# Patient Record
Sex: Female | Born: 1970 | Race: White | Hispanic: No | Marital: Married | Smoking: Former smoker
Health system: Southern US, Community
[De-identification: ages and names within clinical notes are randomized; demographics above are authoritative.]

## PROBLEM LIST (undated history)

## (undated) DIAGNOSIS — I10 Essential (primary) hypertension: Secondary | ICD-10-CM

## (undated) DIAGNOSIS — N200 Calculus of kidney: Secondary | ICD-10-CM

## (undated) DIAGNOSIS — M3500 Sicca syndrome, unspecified: Secondary | ICD-10-CM

## (undated) DIAGNOSIS — M329 Systemic lupus erythematosus, unspecified: Secondary | ICD-10-CM

## (undated) HISTORY — PX: OTHER SURGICAL HISTORY: SHX169

## (undated) HISTORY — PX: TUBAL LIGATION: SHX77

---

## 2005-06-25 ENCOUNTER — Emergency Department: Payer: Self-pay | Admitting: Emergency Medicine

## 2005-06-29 ENCOUNTER — Other Ambulatory Visit: Payer: Self-pay

## 2005-06-29 ENCOUNTER — Emergency Department: Payer: Self-pay | Admitting: Emergency Medicine

## 2005-07-02 ENCOUNTER — Inpatient Hospital Stay: Payer: Self-pay | Admitting: Internal Medicine

## 2005-07-02 ENCOUNTER — Other Ambulatory Visit: Payer: Self-pay

## 2005-07-03 ENCOUNTER — Other Ambulatory Visit: Payer: Self-pay

## 2005-07-04 ENCOUNTER — Other Ambulatory Visit: Payer: Self-pay

## 2005-07-05 ENCOUNTER — Other Ambulatory Visit: Payer: Self-pay

## 2005-07-27 ENCOUNTER — Ambulatory Visit: Payer: Self-pay

## 2005-07-31 ENCOUNTER — Ambulatory Visit: Payer: Self-pay | Admitting: Urology

## 2005-09-05 ENCOUNTER — Ambulatory Visit: Payer: Self-pay | Admitting: Internal Medicine

## 2005-10-11 ENCOUNTER — Ambulatory Visit: Payer: Self-pay | Admitting: Specialist

## 2005-12-11 ENCOUNTER — Ambulatory Visit: Payer: Self-pay | Admitting: Specialist

## 2005-12-21 ENCOUNTER — Emergency Department: Payer: Self-pay | Admitting: Emergency Medicine

## 2006-09-12 ENCOUNTER — Ambulatory Visit: Payer: Self-pay | Admitting: Family Medicine

## 2007-06-25 IMAGING — CR DG CHEST 2V
1 series · 2 of 2 positions shown · non-contrast
Comparison: none

REASON FOR EXAM: DAESIK
COMMENTS:

[Series 4179: postero_anterior · 0.22mm/px · 2 of 2 slices shown]
[im 1/2]
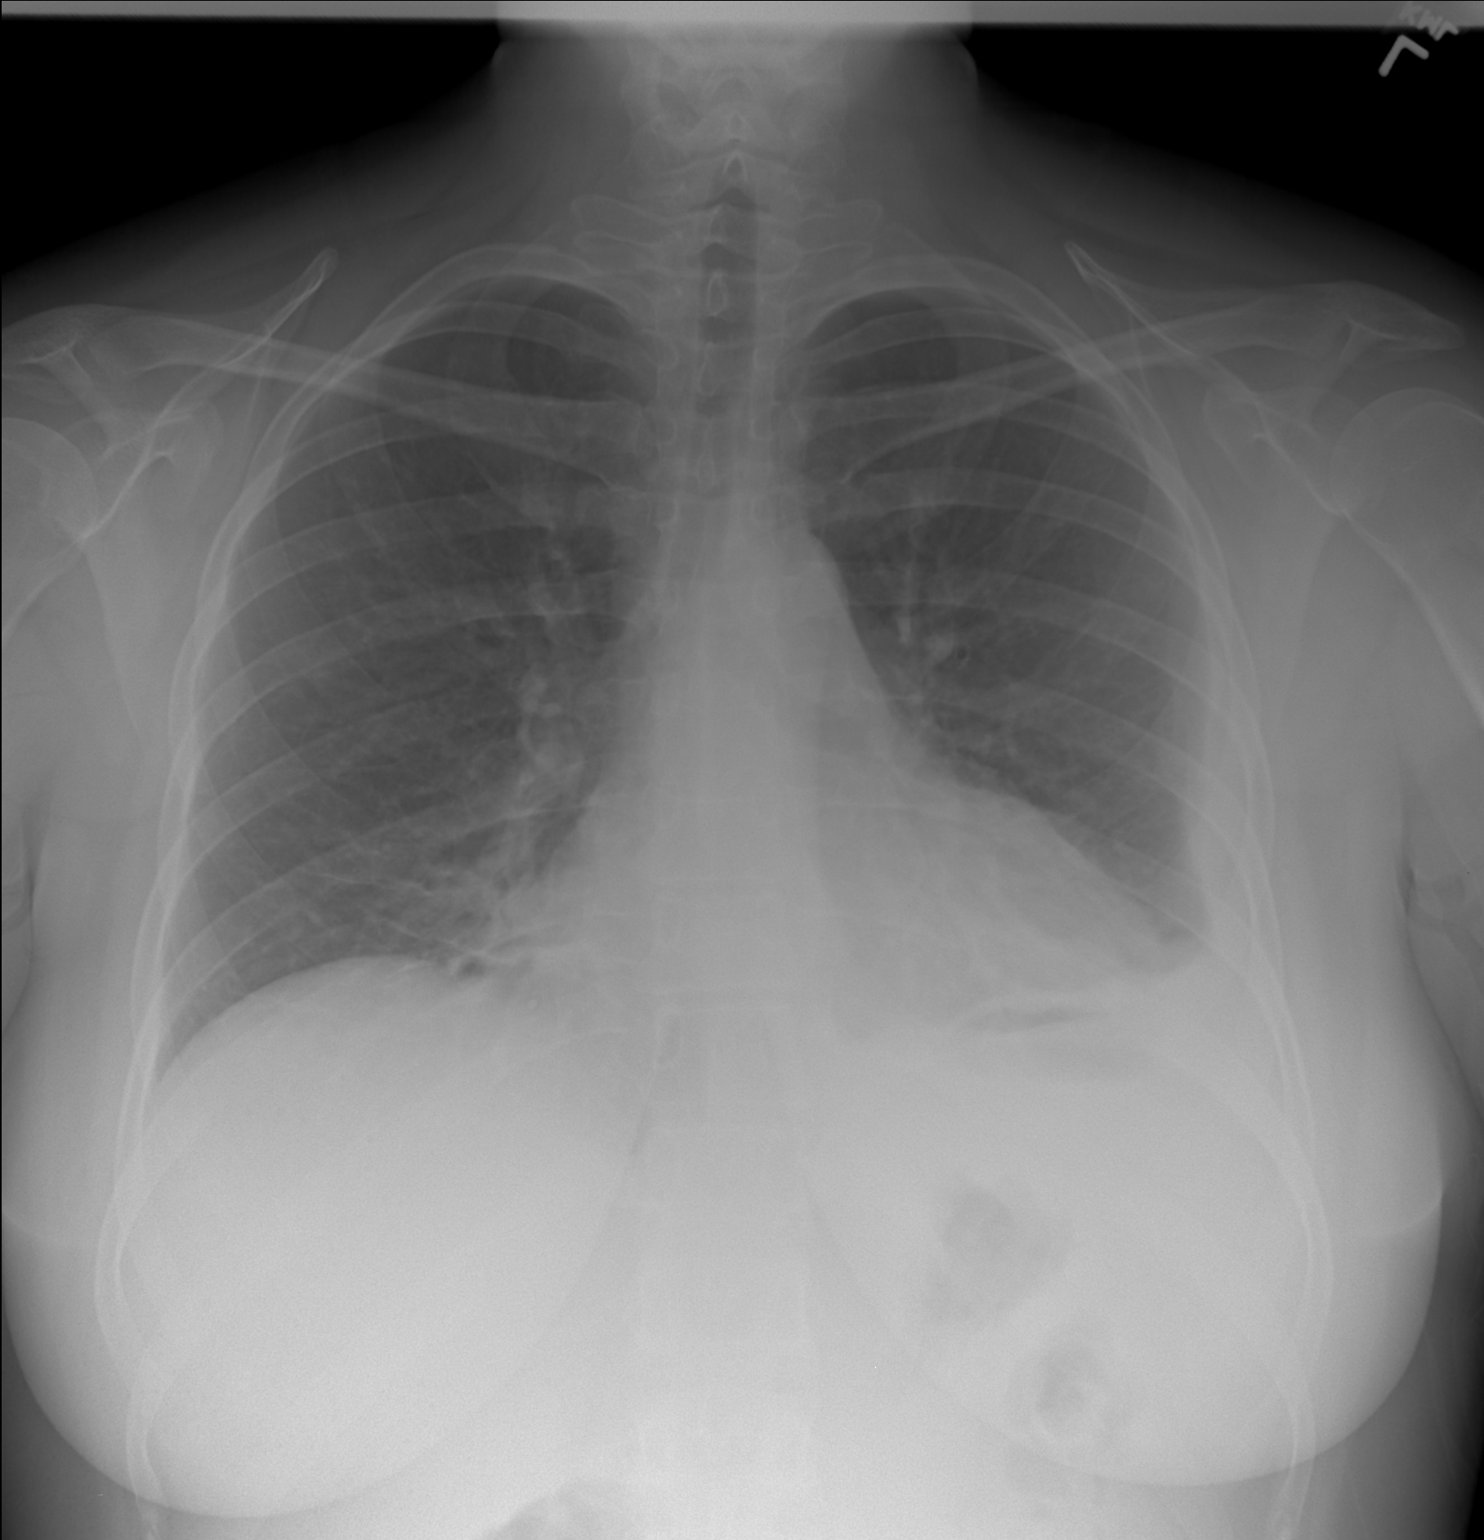
[im 2/2]
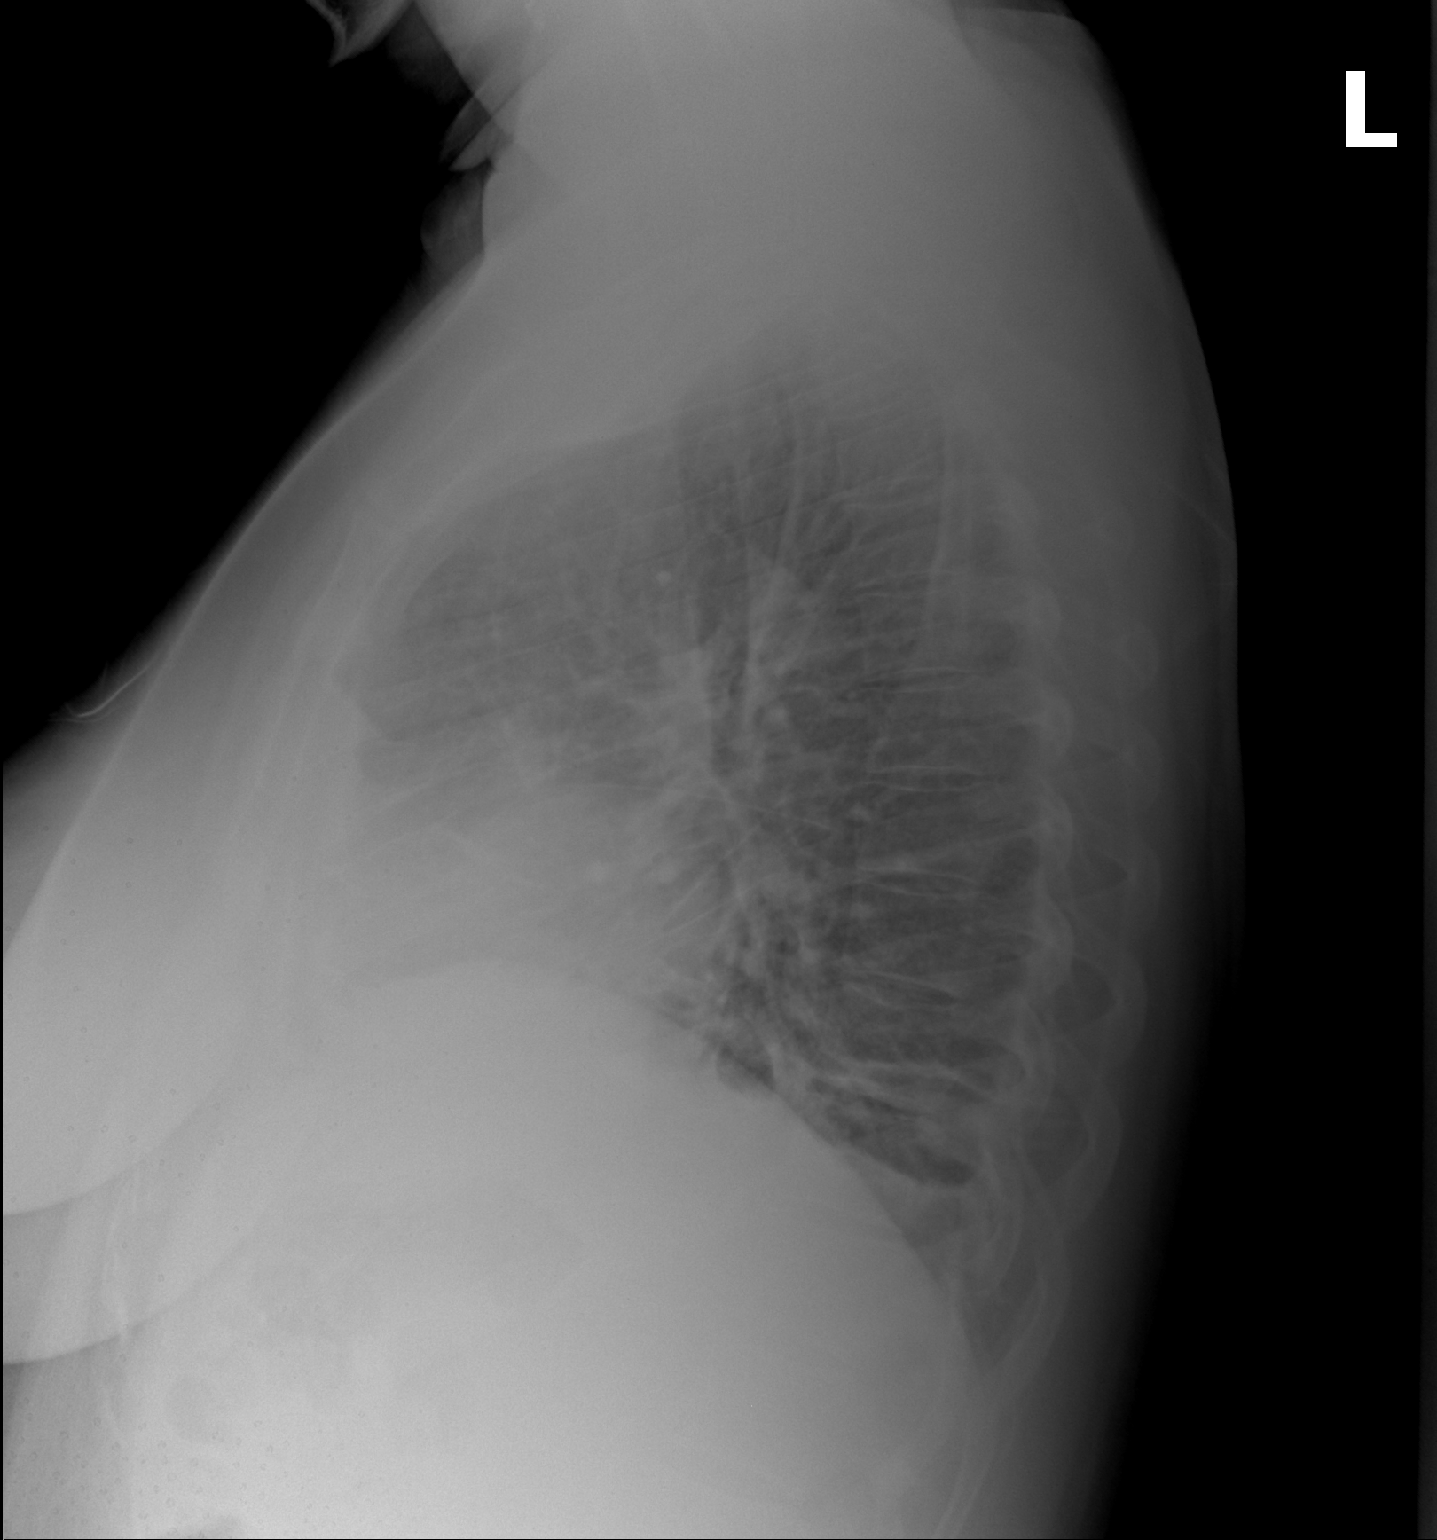

[2 of 2 positions shown; findings below may reference images not displayed]

PROCEDURE:     DXR - DXR CHEST PA (OR AP) AND LATERAL  - December 10, 2005  [DATE]

RESULT:     The current exam is compared to a prior exam of 07/07/05.  The
current exam shows thickening of the pleural shadow along the LEFT lateral
hemithorax and at the LEFT costophrenic angle.  The findings are consistent
with small LEFT pleural effusion although pleural fibrosis could produce a
similar appearance.  No definite pneumonia is seen.  No pneumothorax is
observed.  The pulmonary vasculature is normal in appearance.  Heart size is
normal.
IMPRESSION: There is thickening of the pleural shadow at the LEFT base compatible with a
small effusion or with pleural fibrosis.  This could be further evaluated by
CT if clinically indicated.

## 2008-11-21 ENCOUNTER — Emergency Department: Payer: Self-pay | Admitting: Emergency Medicine

## 2012-04-19 ENCOUNTER — Emergency Department: Payer: Self-pay | Admitting: Emergency Medicine

## 2012-04-19 LAB — URINALYSIS, COMPLETE
Bacteria: NONE SEEN
Bilirubin,UR: NEGATIVE
Glucose,UR: NEGATIVE mg/dL (ref 0–75)
Leukocyte Esterase: NEGATIVE
Ph: 5 (ref 4.5–8.0)
Protein: 100
Specific Gravity: 1.024 (ref 1.003–1.030)
Squamous Epithelial: 14
WBC UR: 2 /HPF (ref 0–5)

## 2012-04-19 LAB — BASIC METABOLIC PANEL
Anion Gap: 5 — ABNORMAL LOW (ref 7–16)
BUN: 9 mg/dL (ref 7–18)
Chloride: 106 mmol/L (ref 98–107)
Co2: 29 mmol/L (ref 21–32)
Creatinine: 0.73 mg/dL (ref 0.60–1.30)
EGFR (Non-African Amer.): >60
Potassium: 3.9 mmol/L (ref 3.5–5.1)
Sodium: 140 mmol/L (ref 136–145)

## 2012-04-19 LAB — CBC
HCT: 40.6 % (ref 35.0–47.0)
MCV: 90 fL (ref 80–100)

## 2016-10-08 HISTORY — PX: BARTHOLIN GLAND CYST EXCISION: SHX565

## 2016-10-13 ENCOUNTER — Emergency Department
Admission: EM | Admit: 2016-10-13 | Discharge: 2016-10-13 | Disposition: A | Discharge status: Home / Self Care | Payer: Commercial Managed Care - PPO | Attending: Emergency Medicine | Admitting: Emergency Medicine

## 2016-10-13 ENCOUNTER — Encounter: Payer: Self-pay | Admitting: *Deleted

## 2016-10-13 DIAGNOSIS — F17210 Nicotine dependence, cigarettes, uncomplicated: Secondary | ICD-10-CM | POA: Diagnosis not present

## 2016-10-13 DIAGNOSIS — R030 Elevated blood-pressure reading, without diagnosis of hypertension: Secondary | ICD-10-CM | POA: Diagnosis not present

## 2016-10-13 DIAGNOSIS — R102 Pelvic and perineal pain: Secondary | ICD-10-CM | POA: Diagnosis present

## 2016-10-13 DIAGNOSIS — N750 Cyst of Bartholin's gland: Secondary | ICD-10-CM | POA: Insufficient documentation

## 2016-10-13 HISTORY — DX: Calculus of kidney: N20.0

## 2016-10-13 HISTORY — DX: Sjogren syndrome, unspecified: M35.00

## 2016-10-13 HISTORY — DX: Systemic lupus erythematosus, unspecified: M32.9

## 2016-10-13 MED ORDER — LIDOCAINE HCL (PF) 1 % IJ SOLN
INTRAMUSCULAR | Status: AC
Start: 2016-10-13 — End: 2016-10-13
  Administered 2016-10-13: 5 mL
  Filled 2016-10-13: qty 5

## 2016-10-13 MED ORDER — SULFAMETHOXAZOLE-TRIMETHOPRIM 800-160 MG PO TABS: 1.0000 | ORAL_TABLET | Freq: Two times a day (BID) | ORAL | 0 refills | Status: DC

## 2016-10-13 NOTE — ED Notes (Signed)
Pt verbalized understanding of discharge instructions. NAD at this time. 

## 2016-10-13 NOTE — ED Provider Notes (Signed)
[facility] Emergency Department Provider Note   ____________________________________________    I have reviewed the triage vital signs and the nursing notes.   HISTORY  Chief Complaint Vaginal Pain     HPI Kristin Finley is a 46 y.o. female who presents with complaints of vaginal pain. She reports worsening pain over the last week along the right side of her vagina. She has never had this before. She says she feels a bump there which is very tender. She denies fevers or chills. No nausea or vomiting.   Past Medical History:  Diagnosis Date  . History of systemic lupus erythematosus (SLE)   . Kidney stone   . Sjogren's syndrome (HCC)     There are no active problems to display for this patient.   Past Surgical History:  Procedure Laterality Date  . lithotripsy Left   . TUBAL LIGATION Bilateral     Prior to Admission medications   Not on File     Allergies Codeine  History reviewed. No pertinent family history.  Social History Social History  Substance Use Topics  . Smoking status: Current Every Day Smoker    Types: Cigarettes  . Smokeless tobacco: Never Used  . Alcohol use No    Review of Systems  Constitutional: No fever/chills     Gastrointestinal: No abdominal pain.  No nausea, no vomiting.   Genitourinary: Negative for dysuria.  Skin: Negative for rash. Neurological: Negative for headaches     ____________________________________________   PHYSICAL EXAM:  VITAL SIGNS: ED Triage Vitals [10/13/16 0638]  Enc Vitals Group     BP (!) 213/115     Pulse Rate 83     Resp 16     Temp 98.2 F (36.8 C)     Temp Source Oral     SpO2 99 %     Weight 200 lb (90.7 kg)     Height 5\' 7"  (1.702 m)     Head Circumference      Peak Flow      Pain Score 4     Pain Loc      Pain Edu?      Excl. in GC?     Constitutional: Alert and oriented. No acute distress. Pleasant and interactive Eyes: Conjunctivae are normal.    Head: Atraumatic. Nose: No congestion/rhinnorhea. Mouth/Throat: Mucous membranes are moist.   Cardiovascular: Normal rate, regular rhythm.  Respiratory: Normal respiratory effort.  No retractions. Genitourinary: Exam performed  With chaperone Paulette. Erythematous and tender area of fluctuance on the right c/w bartholin's cyst Musculoskeletal: No lower extremity tenderness nor edema.   Neurologic:  Normal speech and language. No gross focal neurologic deficits are appreciated.   Skin:  Skin is warm, dry and intact. No rash noted.   ____________________________________________   LABS (all labs ordered are listed, but only abnormal results are displayed)  Labs Reviewed - No data to display ____________________________________________  EKG   ____________________________________________  RADIOLOGY  None ____________________________________________   PROCEDURES  Procedure(s) performed: yes  INCISION AND DRAINAGE Performed by: Jene EveryKINNER, Megan Hayduk Consent: Verbal consent obtained. Risks and benefits: risks, benefits and alternatives were discussed Type: abscess  Body area: bartholin cyst  Anesthesia: local infiltration  Incision was made with a scalpel.  Local anesthetic: lidocaine 1%   Anesthetic total: 2 ml  Complexity: complex   Drainage: purulent  Drainage amount: moderate  Packing material: word catheter, patient could not tolerate inflating balloon  Patient tolerance: Patient tolerated the procedure with no  immediate complications.       Critical Care performed: No ____________________________________________   INITIAL IMPRESSION / ASSESSMENT AND PLAN / ED COURSE  Pertinent labs & imaging results that were available during my care of the patient were reviewed by me and considered in my medical decision making (see chart for details).  Exam consistent with Bartholin's cyst, I and d performed. Patient unable to tolerate inflating word catheter  unfortunately. ABX rx given, follow up with gyn. Discussed with her elevated BP in the ED, likely related to pain. She will need repeat BP check with her pcp. She will schedule appt.   ____________________________________________   FINAL CLINICAL IMPRESSION(S) / ED DIAGNOSES  Final diagnoses:  Bartholin cyst  Elevated BP without diagnosis of hypertension      NEW MEDICATIONS STARTED DURING THIS VISIT:  New Prescriptions   No medications on file     Note:  This document was prepared using Dragon voice recognition software and may include unintentional dictation errors.    Jene Every, MD 10/13/16 [phone]

## 2016-10-13 NOTE — ED Triage Notes (Signed)
Pt c/o painful cyst on R side of labia. Pt states started around Christmas and has gotten progressively worse. Pt states increased pain w/ movement of any kind. Pt denies drainage at this time. Pt states extreme pain to palpation.

## 2017-01-24 ENCOUNTER — Ambulatory Visit (INDEPENDENT_AMBULATORY_CARE_PROVIDER_SITE_OTHER): Payer: Commercial Managed Care - PPO | Admitting: Obstetrics and Gynecology

## 2017-01-24 ENCOUNTER — Encounter: Payer: Self-pay | Admitting: Obstetrics and Gynecology

## 2017-01-24 VITALS — BP 138/81 | HR 79 | Wt 194.3 lb

## 2017-01-24 DIAGNOSIS — N920 Excessive and frequent menstruation with regular cycle: Secondary | ICD-10-CM | POA: Diagnosis not present

## 2017-01-24 NOTE — Progress Notes (Signed)
HPI:      [name] is a 46 y.o. [phone] who LMP was Patient's last menstrual period was 12/24/2016 (approximate).  Subjective:   She presents today Stating that she has not had medical care for a number of years and wants to begin care again. She was seen in the emergency department for heavy bleeding with her period. She says that her periods have been very heavy all of her life but in the last few years seem to be worse. She has significant cramping and bleeding for more than 7 days. She denies intermenstrual bleeding. She does have monthly menses. She is specifically requesting something to make her periods lighter. She smokes tobacco.    Hx: The following portions of the patient's history were reviewed and updated as appropriate:              She  has a past medical history of History of systemic lupus erythematosus (SLE); Kidney stone; and Sjogren's syndrome (HCC). She  does not have a problem list on file. She  has a past surgical history that includes lithotripsy (Left); Tubal ligation (Bilateral); and Bartholin gland cyst excision (10/2016). Her family history includes Cancer in her father; Diabetes in her sister. She  reports that she has been smoking Cigarettes.  She has never used smokeless tobacco. She reports that she does not drink alcohol or use drugs. No current outpatient prescriptions on file prior to visit.   No current facility-administered medications on file prior to visit.          Review of Systems:  Review of Systems  Constitutional: Denied constitutional symptoms, night sweats, recent illness, fatigue, fever, insomnia and weight loss.  Eyes: Denied eye symptoms, eye pain, photophobia, vision change and visual disturbance.  Ears/Nose/Throat/Neck: Denied ear, nose, throat or neck symptoms, hearing loss, nasal discharge, sinus congestion and sore throat.  Cardiovascular: Denied cardiovascular symptoms, arrhythmia, chest pain/pressure, edema, exercise  intolerance, orthopnea and palpitations.  Respiratory: Denied pulmonary symptoms, asthma, pleuritic pain, productive sputum, cough, dyspnea and wheezing.  Gastrointestinal: Denied, gastro-esophageal reflux, melena, nausea and vomiting.  Genitourinary: See HPI for additional information.  Musculoskeletal: Denied musculoskeletal symptoms, stiffness, swelling, muscle weakness and myalgia.  Dermatologic: Denied dermatology symptoms, rash and scar.  Neurologic: Denied neurology symptoms, dizziness, headache, neck pain and syncope.  Psychiatric: Denied psychiatric symptoms, anxiety and depression.  Endocrine: Denied endocrine symptoms including hot flashes and night sweats.   Meds:   No current outpatient prescriptions on file prior to visit.   No current facility-administered medications on file prior to visit.     Objective:     Vitals:   01/24/17 1527  BP: 138/81  Pulse: 79                Assessment:    G2P2002 There are no active problems to display for this patient.    1. Menorrhagia with regular cycle     Possibly fibroids, possibly adenomyosis. Doubt hyperplasia.   Plan:            1.  Ultrasound to rule out fibroids and thickened endometrium.  2.  Patient needs annual exam, Pap, screening blood work, mammogram.  3.  We have discussed various treatment methods to make menses lighter or nonexistent. She likes the idea of IUD use. If her Pap is normal and she has no contraindications for IUD after ultrasound will place Mirena. Risks and benefits of IUD use discussed in detail. She will follow up for her annual exam and  Pap then return for IUD placement with menses. Orders Orders Placed This Encounter  Procedures  . US Transvaginal Non-OB    No orders of the defined types were placed in this encounter.       F/U  Return in about 5 days (around 01/29/2017). I spent 22 minutes with this patient of which greater than 50% was spent discussing heavy menstrual bleeding,  IUD, smoking, fibroid uterus, methods of cycle control.  Elonda Husky, M.D. 01/24/2017 4:32 PM

## 2017-01-30 ENCOUNTER — Encounter: Payer: Self-pay | Admitting: Obstetrics and Gynecology

## 2017-01-30 ENCOUNTER — Other Ambulatory Visit: Payer: Self-pay

## 2017-02-05 ENCOUNTER — Encounter: Payer: Self-pay | Admitting: Obstetrics and Gynecology

## 2017-02-05 ENCOUNTER — Other Ambulatory Visit: Payer: Self-pay

## 2017-02-13 ENCOUNTER — Encounter: Payer: Self-pay | Admitting: Obstetrics and Gynecology

## 2017-02-22 ENCOUNTER — Encounter: Payer: Self-pay | Admitting: Obstetrics and Gynecology

## 2017-02-22 ENCOUNTER — Other Ambulatory Visit: Payer: Self-pay

## 2017-10-06 ENCOUNTER — Other Ambulatory Visit: Payer: Self-pay

## 2017-10-06 ENCOUNTER — Encounter: Payer: Self-pay | Admitting: Radiology

## 2017-10-06 ENCOUNTER — Emergency Department: Payer: Self-pay

## 2017-10-06 ENCOUNTER — Emergency Department
Admission: EM | Admit: 2017-10-06 | Discharge: 2017-10-07 | Disposition: A | Discharge status: Home / Self Care | Payer: Self-pay | Attending: Emergency Medicine | Admitting: Emergency Medicine

## 2017-10-06 DIAGNOSIS — F17210 Nicotine dependence, cigarettes, uncomplicated: Secondary | ICD-10-CM | POA: Insufficient documentation

## 2017-10-06 DIAGNOSIS — R0789 Other chest pain: Secondary | ICD-10-CM | POA: Insufficient documentation

## 2017-10-06 DIAGNOSIS — Z8739 Personal history of other diseases of the musculoskeletal system and connective tissue: Secondary | ICD-10-CM | POA: Insufficient documentation

## 2017-10-06 DIAGNOSIS — R05 Cough: Secondary | ICD-10-CM | POA: Insufficient documentation

## 2017-10-06 DIAGNOSIS — R079 Chest pain, unspecified: Secondary | ICD-10-CM

## 2017-10-06 DIAGNOSIS — Z87442 Personal history of urinary calculi: Secondary | ICD-10-CM | POA: Insufficient documentation

## 2017-10-06 DIAGNOSIS — R6889 Other general symptoms and signs: Secondary | ICD-10-CM | POA: Insufficient documentation

## 2017-10-06 LAB — BASIC METABOLIC PANEL
Anion gap: 5 (ref 5–15)
BUN: 12 mg/dL (ref 6–20)
CALCIUM: 8.4 mg/dL — AB (ref 8.9–10.3)
CO2: 26 mmol/L (ref 22–32)
CREATININE: 0.7 mg/dL (ref 0.44–1.00)
Chloride: 108 mmol/L (ref 101–111)
GFR calc Af Amer: >60 mL/min (ref >60)
GFR calc non Af Amer: >60 mL/min (ref >60)
GLUCOSE: 134 mg/dL — AB (ref 65–99)
Potassium: 3 mmol/L — ABNORMAL LOW (ref 3.5–5.1)
Sodium: 139 mmol/L (ref 135–145)

## 2017-10-06 LAB — CBC
HCT: 37.5 % (ref 35.0–47.0)
Hemoglobin: 12.6 g/dL (ref 12.0–16.0)
MCH: 28.1 pg (ref 26.0–34.0)
MCHC: 33.7 g/dL (ref 32.0–36.0)
MCV: 83.5 fL (ref 80.0–100.0)
PLATELETS: 268 10*3/uL (ref 150–440)
RBC: 4.48 MIL/uL (ref 3.80–5.20)
RDW: 14.7 % — AB (ref 11.5–14.5)
WBC: 4.6 10*3/uL (ref 3.6–11.0)

## 2017-10-06 LAB — TROPONIN I

## 2017-10-06 LAB — POCT PREGNANCY, URINE: Preg Test, Ur: NEGATIVE

## 2017-10-06 MED ORDER — IOPAMIDOL (ISOVUE-370) INJECTION 76%
75.0000 mL | Freq: Once | INTRAVENOUS | Status: AC | PRN
  Administered 2017-10-06: 75 mL via INTRAVENOUS

## 2017-10-06 MED ORDER — KETOROLAC TROMETHAMINE 30 MG/ML IJ SOLN
15.0000 mg | Freq: Once | INTRAMUSCULAR | Status: AC
  Administered 2017-10-06: 15 mg via INTRAVENOUS
  Filled 2017-10-06: qty 1

## 2017-10-06 NOTE — ED Triage Notes (Signed)
Pt states that she has been having chest pressure all day, pain radiates to the tops of her shoulders, pt states pain with deep breathing

## 2017-10-06 NOTE — Discharge Instructions (Addendum)

## 2017-10-06 NOTE — ED Provider Notes (Signed)
[facility] Emergency Department Provider Note  ____________________________________________  Time seen: Approximately 10:25 PM  I have reviewed the triage vital signs and the nursing notes.   HISTORY  Chief Complaint Chest Pain   HPI Kristin Finley is a 46 y.o. female with a history of lupus who presents for evaluation of chest pain.Patient reports pain that started today. She reports that the pain is a heaviness in the center of her chest associated with a sharp pain on bilateral upper chest that is pleuritic in nature, 8 out of 10, constant, nonradiating. She denies prior history of blood clots, family history blood clots, recent travel immobilization, exogenous hormones, leg pain or swelling, or hemoptysis. She does not take any immunosuppressive agents. She has had a very minimal dry cough today. She denies congestion, sore throat, vomiting, diarrhea, abdominal pain. She denies shortness of breath. She is a smoker. No family history of ischemic heart disease. She's never had a stress test. She endorses nausea. She also reports body aches and feeling like she has a fever.  Past Medical History:  Diagnosis Date  . History of systemic lupus erythematosus (SLE)   . Kidney stone   . Sjogren's syndrome (HCC)     There are no active problems to display for this patient.   Past Surgical History:  Procedure Laterality Date  . BARTHOLIN GLAND CYST EXCISION  10/2016  . lithotripsy Left   . TUBAL LIGATION Bilateral     Prior to Admission medications   Not on File    Allergies Codeine  Family History  Problem Relation Age of Onset  . Cancer Father   . Diabetes Sister     Social History Social History   Tobacco Use  . Smoking status: Current Every Day Smoker    Types: Cigarettes  . Smokeless tobacco: Never Used  Substance Use Topics  . Alcohol use: No  . Drug use: No    Review of Systems  Constitutional: Negative for fever. + body  aches Eyes: Negative for visual changes. ENT: Negative for sore throat. Neck: No neck pain  Cardiovascular: + chest pain. Respiratory: Negative for shortness of breath. Gastrointestinal: Negative for abdominal pain, vomiting or diarrhea. + nausea Genitourinary: Negative for dysuria. Musculoskeletal: Negative for back pain. Skin: Negative for rash. Neurological: Negative for headaches, weakness or numbness. Psych: No SI or HI  ____________________________________________   PHYSICAL EXAM:  VITAL SIGNS: ED Triage Vitals [10/06/17 2014]  Enc Vitals Group     BP (!) 166/94     Pulse Rate 85     Resp 16     Temp 98.5 F (36.9 C)     Temp Source Oral     SpO2 100 %     Weight 200 lb (90.7 kg)     Height 5\' 7"  (1.702 m)     Head Circumference      Peak Flow      Pain Score 6     Pain Loc      Pain Edu?      Excl. in GC?     Constitutional: Alert and oriented. Well appearing and in no apparent distress. HEENT:      Head: Normocephalic and atraumatic.         Eyes: Conjunctivae are normal. Sclera is non-icteric.       Mouth/Throat: Mucous membranes are moist.       Neck: Supple with no signs of meningismus. Cardiovascular: Regular rate and rhythm. No murmurs, gallops, or rubs.  2+ symmetrical distal pulses are present in all extremities. No JVD. Respiratory: Normal respiratory effort. Lungs are clear to auscultation bilaterally. No wheezes, crackles, or rhonchi.  Gastrointestinal: Soft, non tender, and non distended with positive bowel sounds. No rebound or guarding. Musculoskeletal: Nontender with normal range of motion in all extremities. No edema, cyanosis, or erythema of extremities. Neurologic: Normal speech and language. Face is symmetric. Moving all extremities. No gross focal neurologic deficits are appreciated. Skin: Skin is warm, dry and intact. No rash noted. Psychiatric: Mood and affect are normal. Speech and behavior are  normal.  ____________________________________________   LABS (all labs ordered are listed, but only abnormal results are displayed)  Labs Reviewed  BASIC METABOLIC PANEL - Abnormal; Notable for the following components:      Result Value   Potassium 3.0 (*)    Glucose, Bld 134 (*)    Calcium 8.4 (*)    All other components within normal limits  CBC - Abnormal; Notable for the following components:   RDW 14.7 (*)    All other components within normal limits  TROPONIN I  TROPONIN I  POC URINE PREG, ED  POCT PREGNANCY, URINE   ____________________________________________  EKG  ED ECG REPORT I, Nita Sicklearolina Davide Risdon, the attending physician, personally viewed and interpreted this ECG.  Normal sinus rhythm, rate of 76, normal intervals, left axis deviation, Q waves in inferior and anterior leads, no ST elevation or depression. Unchanged from prior from 2013 ____________________________________________  RADIOLOGY  CXR: Negative  CTA chest:  1. No evidence of pulmonary embolus. 2. Minimal left lingular atelectasis. Lungs otherwise clear. 3. Two left adrenal adenomas incidentally noted, benign in appearance. 4. Nonobstructing 3 mm stone at the upper pole of the left kidney. ____________________________________________   PROCEDURES  Procedure(s) performed: None Procedures Critical Care performed:  None ____________________________________________   INITIAL IMPRESSION / ASSESSMENT AND PLAN / ED COURSE   46 y.o. female with a history of lupus who presents for evaluation of chest pain that she describes as pleuritic since earlier today. Patient does have a history of lupus which makes her more prone to have blood clots. She is moderate risk on well's criteria. Her chest x-ray shows no evidence of pneumonia, her lungs are clear on auscultation. We'll send patient for CT angiogram to rule out a PE. This could also be due to a viral URI although patient has no congestion and just  a very minimal cough for the last few days. Does not believe her symptoms are due to ACS. Her EKG shows no evidence of ischemia. Troponin is negative.    ----------------------------------------- 10:08 PM on 10/06/2017 ----------------------------------------- OBSERVATION CARE: This patient is being placed under observation care for the following reasons: Chest pain with repeat testing to rule out ischemia  _________________________ 11:39 PM on 10/06/2017 -----------------------------------------  CT negative for PE. Presentation most likely from a viral process versus musculoskeletal pain. We'll give a dose of Toradol. Second troponin is due now. Anticipate discharge home.   ----------------------------------------- 12:12 AM on 10/07/2017 -----------------------------------------  END OF OBSERVATION STATUS: After an appropriate period of observation, this patient is being discharged due to the following reason(s):  CTA negative for PE, no PNA, troponin x2 negative, Pain resolved after toradol.. Patient dc home on standard precautions and f/u with PCP.   As part of my medical decision making, I reviewed the following data within the electronic MEDICAL RECORD NUMBER Nursing notes reviewed and incorporated, Labs reviewed , EKG interpreted , Old EKG reviewed, Radiograph reviewed ,  Notes from prior ED visits and [place] Controlled Substance Database    Pertinent labs & imaging results that were available during my care of the patient were reviewed by me and considered in my medical decision making (see chart for details).    ____________________________________________   FINAL CLINICAL IMPRESSION(S) / ED DIAGNOSES  Final diagnoses:  Chest pain, unspecified type      NEW MEDICATIONS STARTED DURING THIS VISIT:  ED Discharge Orders    None       Note:  This document was prepared using Dragon voice recognition software and may include unintentional dictation errors.    Nita Sickle, MD 10/07/17 [phone]

## 2017-10-06 NOTE — ED Notes (Signed)
Patient transported to CT 

## 2017-10-07 LAB — TROPONIN I

## 2017-10-07 NOTE — ED Provider Notes (Signed)
I assumed care of the patient from Dr. Don PerkingVeronese with repeat troponin pending.  Repeat troponin was negative less than 0.03 patient states that chest pain has resolved.  As per Dr. Don PerkingVeronese recommendation patient will be discharged home cardiology.   Darci CurrentBrown, [place], MD 10/07/17 [phone]

## 2017-10-07 NOTE — ED Notes (Signed)
Pt using call bell and asking about results and disposition; MD aware;

## 2017-10-09 ENCOUNTER — Telehealth: Payer: Self-pay

## 2017-10-09 NOTE — Telephone Encounter (Signed)
Lmov for patient to call back and schedule ED fu  °Seen on 10/06/17  °Will try again at a later time °

## 2017-10-30 NOTE — Telephone Encounter (Signed)
Lmov for patient to call back and schedule ED fu  Seen on 10/06/17  Will try again at a later time

## 2018-01-11 ENCOUNTER — Other Ambulatory Visit: Payer: Self-pay

## 2018-01-11 ENCOUNTER — Emergency Department
Admission: EM | Admit: 2018-01-11 | Discharge: 2018-01-11 | Disposition: A | Discharge status: Home / Self Care | Payer: Self-pay | Attending: Emergency Medicine | Admitting: Emergency Medicine

## 2018-01-11 DIAGNOSIS — R21 Rash and other nonspecific skin eruption: Secondary | ICD-10-CM | POA: Insufficient documentation

## 2018-01-11 DIAGNOSIS — F17210 Nicotine dependence, cigarettes, uncomplicated: Secondary | ICD-10-CM | POA: Insufficient documentation

## 2018-01-11 LAB — CBC
HCT: 40.7 % (ref 35.0–47.0)
Hemoglobin: 13.4 g/dL (ref 12.0–16.0)
MCH: 26.6 pg (ref 26.0–34.0)
MCHC: 32.8 g/dL (ref 32.0–36.0)
MCV: 81 fL (ref 80.0–100.0)
PLATELETS: 282 10*3/uL (ref 150–440)
RBC: 5.02 MIL/uL (ref 3.80–5.20)
RDW: 14.5 % (ref 11.5–14.5)
WBC: 4.7 10*3/uL (ref 3.6–11.0)

## 2018-01-11 LAB — URINALYSIS, COMPLETE (UACMP) WITH MICROSCOPIC
Bilirubin Urine: NEGATIVE
GLUCOSE, UA: NEGATIVE mg/dL
Hgb urine dipstick: NEGATIVE
KETONES UR: 5 mg/dL — AB
Leukocytes, UA: NEGATIVE
Nitrite: NEGATIVE
PH: 6 (ref 5.0–8.0)
Protein, ur: 30 mg/dL — AB
Specific Gravity, Urine: 1.019 (ref 1.005–1.030)

## 2018-01-11 LAB — BASIC METABOLIC PANEL
Anion gap: 6 (ref 5–15)
BUN: 9 mg/dL (ref 6–20)
CO2: 26 mmol/L (ref 22–32)
Calcium: 9 mg/dL (ref 8.9–10.3)
Chloride: 107 mmol/L (ref 101–111)
Creatinine, Ser: 0.74 mg/dL (ref 0.44–1.00)
GFR calc non Af Amer: >60 mL/min (ref >60)
GLUCOSE: 107 mg/dL — AB (ref 65–99)
POTASSIUM: 3.5 mmol/L (ref 3.5–5.1)
Sodium: 139 mmol/L (ref 135–145)

## 2018-01-11 LAB — PREGNANCY, URINE: Preg Test, Ur: NEGATIVE

## 2018-01-11 MED ORDER — KETOROLAC TROMETHAMINE 30 MG/ML IJ SOLN
30.0000 mg | Freq: Once | INTRAMUSCULAR | Status: AC
  Administered 2018-01-11: 30 mg via INTRAMUSCULAR
  Filled 2018-01-11: qty 1

## 2018-01-11 NOTE — Discharge Instructions (Addendum)
Please seek medical attention for any high fevers, chest pain, shortness of breath, change in behavior, persistent vomiting, bloody stool or any other new or concerning symptoms.  

## 2018-01-11 NOTE — ED Provider Notes (Signed)
[facility] Emergency Department Provider Note   ____________________________________________   I have reviewed the triage vital signs and the nursing notes.   HISTORY  Chief Complaint Flank Pain   History limited by: Not Limited   HPI Kristin Finley is a 47 y.o. female who presents to the emergency department today because of concern for right flank pain. Started yesterday morning. Patient had done a large stretch the day before when she was reaching up for something. She thought the pain initially was from that.  However she did start feeling a raised area in her back.  She describes it as like a pencil.  This has increased since then.  Additionally the pain is gotten worse and is constant.  Patient denies similar symptoms in the past.  She denies any shortness of breath, no diarrhea, no nausea or vomiting, no urinary tract infection.  Per medical record review patient has a history of lupus.   Past Medical History:  Diagnosis Date  . History of systemic lupus erythematosus (SLE) (HCC)   . Kidney stone   . Sjogren's syndrome (HCC)     There are no active problems to display for this patient.   Past Surgical History:  Procedure Laterality Date  . BARTHOLIN GLAND CYST EXCISION  10/2016  . lithotripsy Left   . TUBAL LIGATION Bilateral     Prior to Admission medications   Not on File    Allergies Codeine  Family History  Problem Relation Age of Onset  . Cancer Father   . Diabetes Sister     Social History Social History   Tobacco Use  . Smoking status: Current Every Day Smoker    Types: Cigarettes  . Smokeless tobacco: Never Used  Substance Use Topics  . Alcohol use: No  . Drug use: No    Review of Systems Constitutional: No fever/chills Eyes: No visual changes. ENT: No sore throat. Cardiovascular: Denies chest pain. Respiratory: Denies shortness of breath. Gastrointestinal: No abdominal pain.  No nausea, no vomiting.  No  diarrhea.   Genitourinary: Negative for dysuria. Musculoskeletal: Positive for right flank pain. Skin: Positive for rash. Neurological: Negative for headaches, focal weakness or numbness.  ____________________________________________   PHYSICAL EXAM:  VITAL SIGNS: ED Triage Vitals  Enc Vitals Group     BP 01/11/18 1123 (!) 189/115     Pulse Rate 01/11/18 1123 71     Resp 01/11/18 1123 18     Temp 01/11/18 1123 98.5 F (36.9 C)     Temp Source 01/11/18 1123 Oral     SpO2 01/11/18 1123 99 %     Weight 01/11/18 1123 190 lb (86.2 kg)     Height 01/11/18 1123 5\' 7"  (1.702 m)     Head Circumference --      Peak Flow --      Pain Score 01/11/18 1133 5   Constitutional: Alert and oriented. Well appearing and in no distress. Eyes: Conjunctivae are normal.  ENT   Head: Normocephalic and atraumatic.   Nose: No congestion/rhinnorhea.   Mouth/Throat: Mucous membranes are moist.   Neck: No stridor. Hematological/Lymphatic/Immunilogical: No cervical lymphadenopathy. Cardiovascular: Normal rate, regular rhythm.  No murmurs, rubs, or gallops.  Respiratory: Normal respiratory effort without tachypnea nor retractions. Breath sounds are clear and equal bilaterally. No wheezes/rales/rhonchi. Gastrointestinal: Soft and non tender. No rebound. No guarding.  Genitourinary: Deferred Musculoskeletal: Normal range of motion in all extremities. No lower extremity edema. Neurologic:  Normal speech and language. No gross focal  neurologic deficits are appreciated.  Skin:  Linear rash on right flank roughly 10 cm x 1 cm. Also with two smaller parallel rashes. Palpable.  Psychiatric: Mood and affect are normal. Speech and behavior are normal. Patient exhibits appropriate insight and judgment.  ____________________________________________    LABS (pertinent positives/negatives)  BMP wnl except glu 107 UA ketones 5, protein 30, rbc and wbc 0.5, few bacteria CBC  wnl ____________________________________________   EKG  None  ____________________________________________    RADIOLOGY  None  ____________________________________________   PROCEDURES  Procedures  ____________________________________________   INITIAL IMPRESSION / ASSESSMENT AND PLAN / ED COURSE  Pertinent labs & imaging results that were available during my care of the patient were reviewed by me and considered in my medical decision making (see chart for details).  Patient presented to the emergency department today because of concerns for right flank pain.  On exam she does have some linear rashes to the right flank pain they are palpable.  At this point I doubt superficial thrombophlebitis given it does not follow vascular pattern.  Additionally no vesicular lesions to suggest shingles. It does appear to be most consistent with flagellent erythema, potentially from mushrooms, which the patient apparently eats a lot of. Blood work and urine without concerning findings. Discussed findings and thoughts with patient. Will discharge to follow up with PCP and dermatology.   ____________________________________________   FINAL CLINICAL IMPRESSION(S) / ED DIAGNOSES  Final diagnoses:  Rash     Note: This dictation was prepared with Dragon dictation. Any transcriptional errors that result from this process are unintentional     Phineas Semen, MD 01/11/18 1704

## 2018-01-11 NOTE — ED Triage Notes (Signed)
Pt states R side pain that began Thursday after stretching R arm too far. Didn't do much yesterday but states woke up this AM with increased pain. Pt pointing to ribs/kidney area. Alert, oriented, ambulatory. Also c/o cough. Denies urinary symptoms. Denies blood in urine. Denies pain moving anywhere.

## 2019-04-21 IMAGING — CT CT ANGIO CHEST
2 of 6 series · 18 of 46 positions shown · IV contrast (APPLIED)
Comparison: CT of the chest performed 07/27/2005, and chest
radiograph performed earlier today at [DATE] p.m.

CLINICAL DATA: Acute onset of generalized chest pressure.

EXAM:
CT ANGIOGRAPHY CHEST WITH CONTRAST
TECHNIQUE: Multidetector CT imaging of the chest was performed using the
standard protocol during bolus administration of intravenous
contrast. Multiplanar CT image reconstructions and MIPs were
obtained to evaluate the vascular anatomy.
CONTRAST:  75mL D2O0OH-HYW IOPAMIDOL (D2O0OH-HYW) INJECTION 76%

[Series 5: thins · axial · 0.72mm/px · z∈[+30,+294]mm · 15 of 290 slices shown]
[im 13/290  lung]
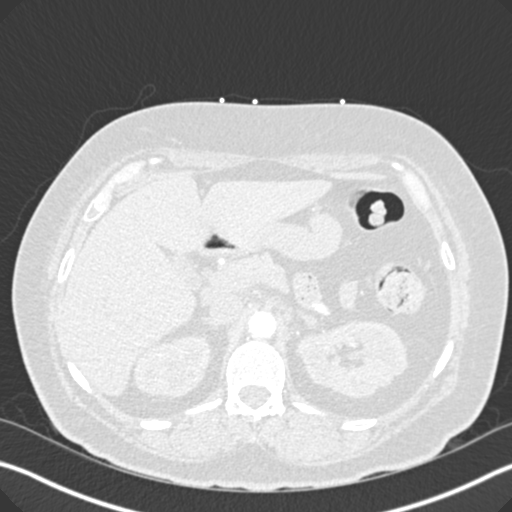
[im 38/290  soft-tissue]
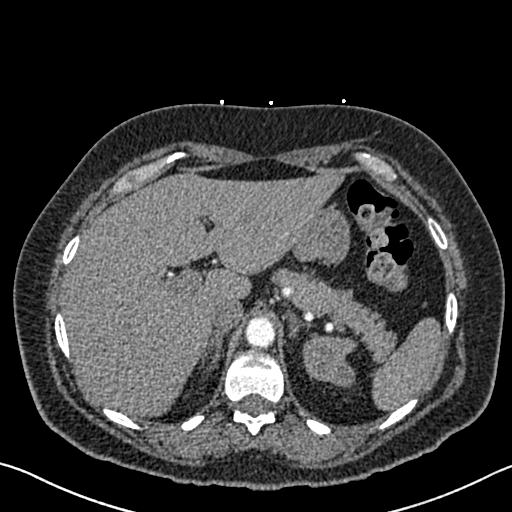
[im 51/290  lung]
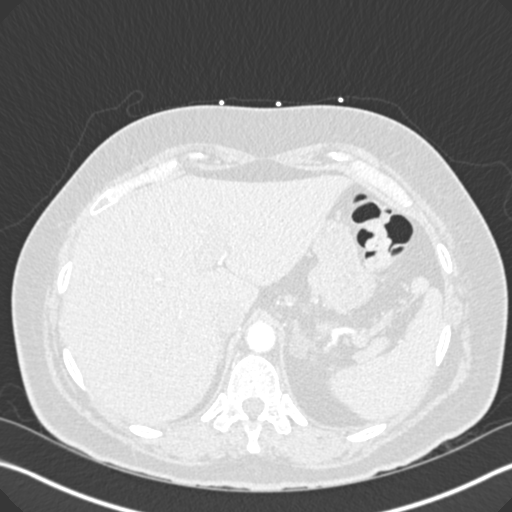
[im 76/290  soft-tissue]
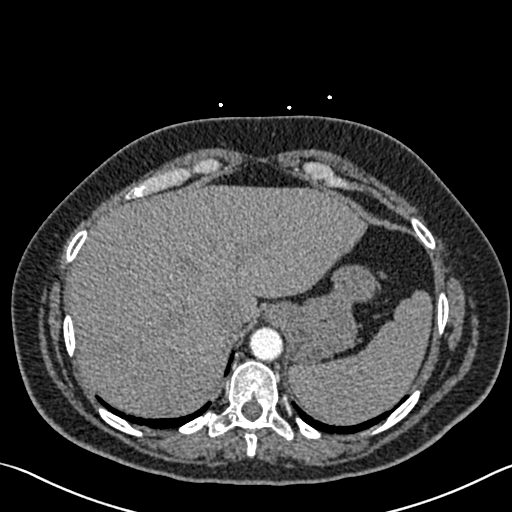
[im 88/290  lung]
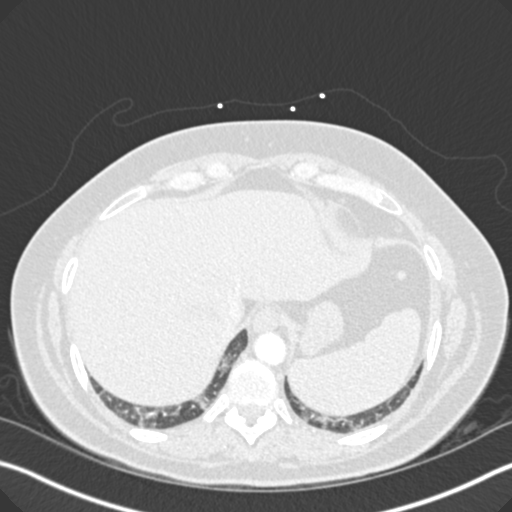
[im 114/290  soft-tissue]
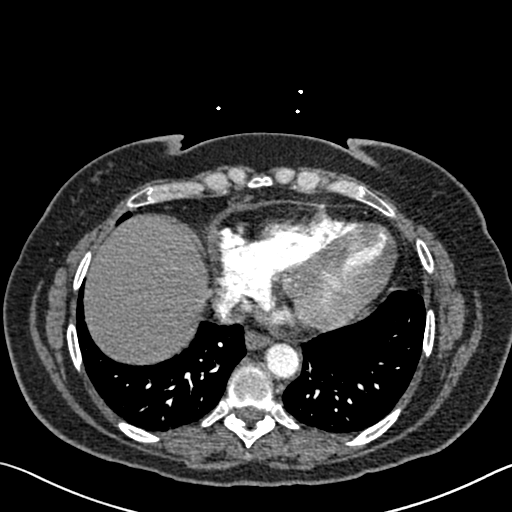
[im 126/290  lung]
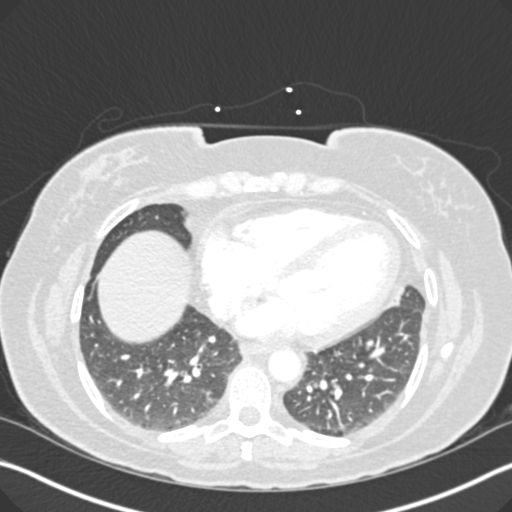
[im 151/290  soft-tissue]
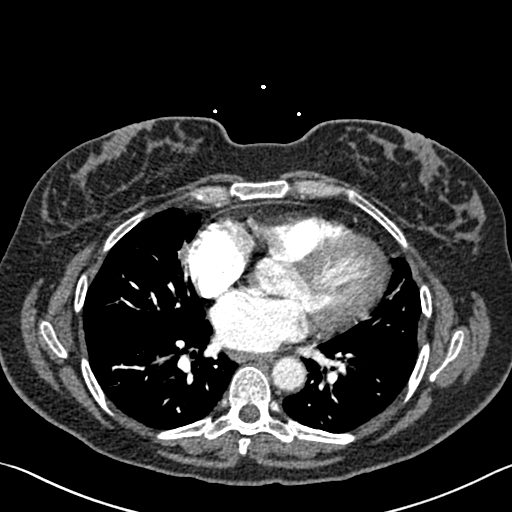
[im 164/290  lung]
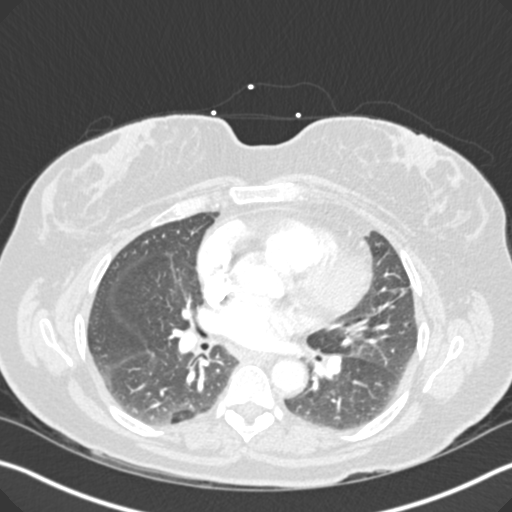
[im 176/290  soft-tissue]
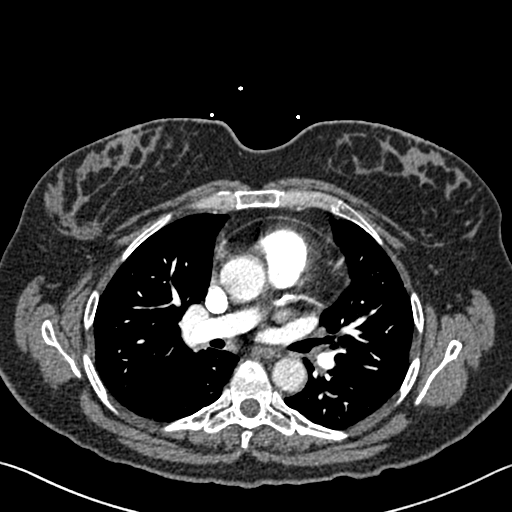
[im 202/290  lung]
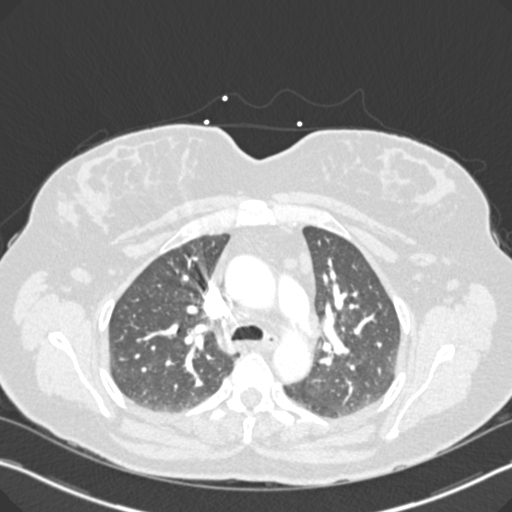
[im 214/290  soft-tissue]
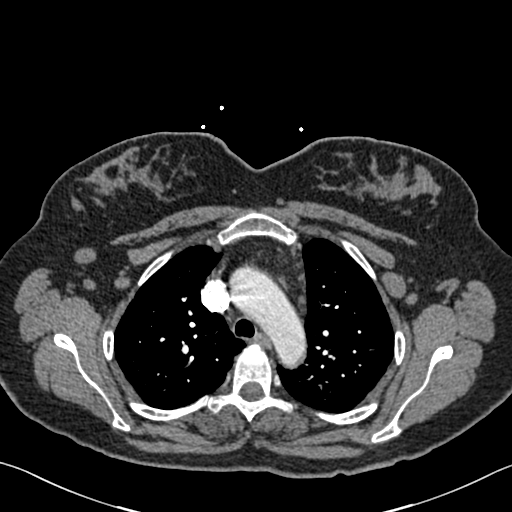
[im 239/290  lung]
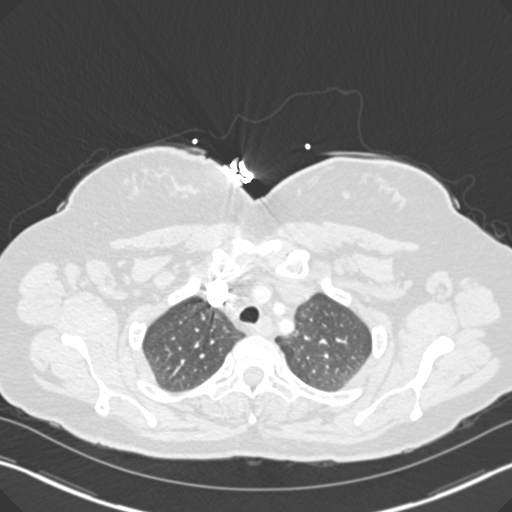
[im 252/290  soft-tissue]
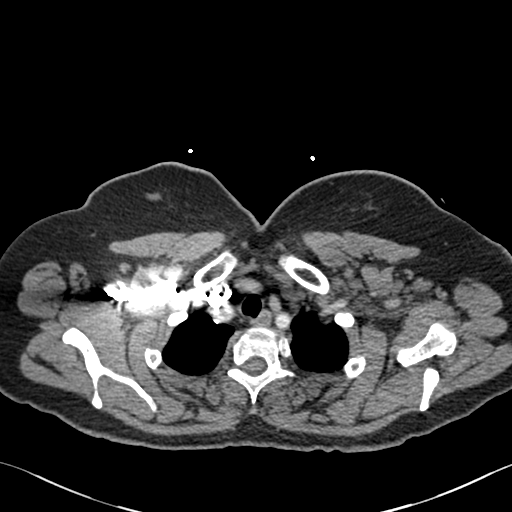
[im 277/290  lung]
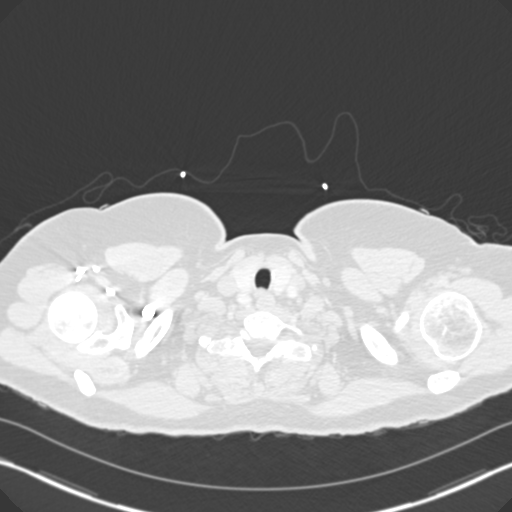

[Series 7: coronal mpr · coronal · 0.57mm/px · 3 of 86 slices shown]
[im 22/86  soft-tissue]
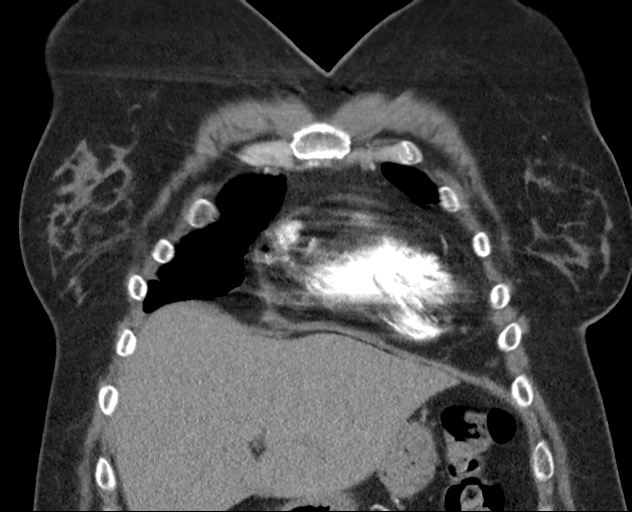
[im 43/86  soft-tissue]
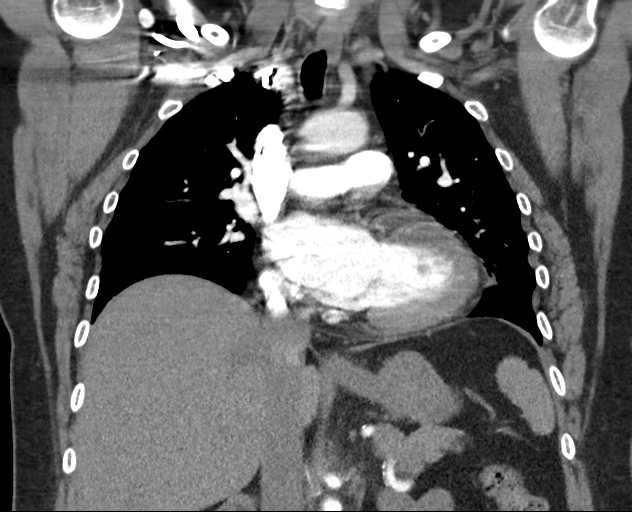
[im 64/86  soft-tissue]
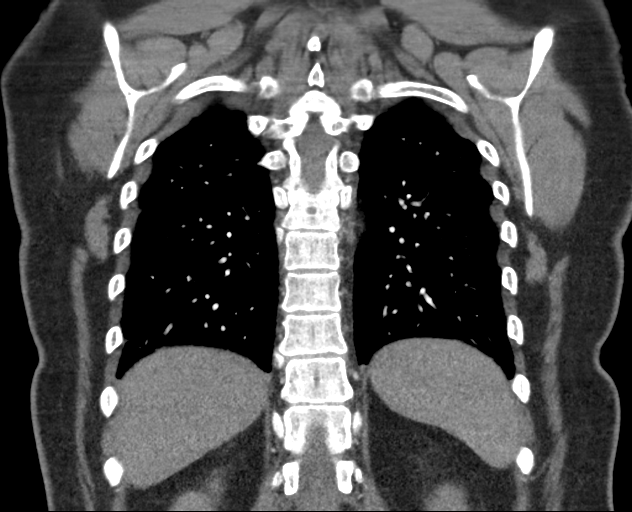

[18 of 46 positions shown; findings below may reference images not displayed]

FINDINGS: Cardiovascular:  There is no evidence of pulmonary embolus.

The heart is borderline normal in size. The thoracic aorta is
grossly unremarkable. The great vessels are within normal limits.

Mediastinum/Nodes: The mediastinum is unremarkable in appearance.
Trace pericardial fluid remains within normal limits. No mediastinal
lymphadenopathy is seen. The visualized portions of the thyroid
gland are unremarkable. Scattered axillary nodes remain normal in
size.

Lungs/Pleura: Minimal left lingular atelectasis is noted. The lungs
are otherwise grossly clear. No pleural effusion or pneumothorax is
seen. No masses are identified.

Upper Abdomen: The visualized portions of the liver and spleen are
unremarkable. The visualized portions of the gallbladder, pancreas
and right adrenal gland are unremarkable. Two left adrenal adenomas
are noted, measuring in 1.4 cm and 1.6 cm in size. A nonobstructing
3 mm stone is noted at the upper pole of the left kidney.

Musculoskeletal: No acute osseous abnormalities are identified. The
visualized musculature is unremarkable in appearance.

Review of the MIP images confirms the above findings.
IMPRESSION: 1. No evidence of pulmonary embolus.
2. Minimal left lingular atelectasis.  Lungs otherwise clear.
3. Two left adrenal adenomas incidentally noted, benign in
appearance.
4. Nonobstructing 3 mm stone at the upper pole of the left kidney.

## 2020-05-31 ENCOUNTER — Ambulatory Visit: Payer: Self-pay | Attending: Oncology

## 2020-05-31 ENCOUNTER — Telehealth: Payer: Self-pay | Admitting: *Deleted

## 2020-06-01 ENCOUNTER — Telehealth: Payer: Self-pay | Admitting: *Deleted

## 2022-07-08 ENCOUNTER — Other Ambulatory Visit: Payer: Self-pay

## 2022-07-08 ENCOUNTER — Emergency Department: Payer: Self-pay

## 2022-07-08 ENCOUNTER — Inpatient Hospital Stay
Admission: EM | Admit: 2022-07-08 | Discharge: 2022-07-10 | DRG: 322 | Disposition: A | Discharge status: Home / Self Care | Payer: Self-pay | Attending: Internal Medicine | Admitting: Internal Medicine

## 2022-07-08 ENCOUNTER — Encounter
Admission: EM | Disposition: A | Discharge status: Home / Self Care | Payer: Self-pay | Source: Home / Self Care | Attending: Internal Medicine

## 2022-07-08 DIAGNOSIS — I2510 Atherosclerotic heart disease of native coronary artery without angina pectoris: Secondary | ICD-10-CM | POA: Diagnosis present

## 2022-07-08 DIAGNOSIS — E876 Hypokalemia: Secondary | ICD-10-CM | POA: Diagnosis present

## 2022-07-08 DIAGNOSIS — I2119 ST elevation (STEMI) myocardial infarction involving other coronary artery of inferior wall: Principal | ICD-10-CM | POA: Diagnosis present

## 2022-07-08 DIAGNOSIS — R918 Other nonspecific abnormal finding of lung field: Secondary | ICD-10-CM | POA: Diagnosis present

## 2022-07-08 DIAGNOSIS — Z716 Tobacco abuse counseling: Secondary | ICD-10-CM

## 2022-07-08 DIAGNOSIS — Z885 Allergy status to narcotic agent status: Secondary | ICD-10-CM

## 2022-07-08 DIAGNOSIS — R7989 Other specified abnormal findings of blood chemistry: Secondary | ICD-10-CM | POA: Diagnosis present

## 2022-07-08 DIAGNOSIS — I959 Hypotension, unspecified: Secondary | ICD-10-CM | POA: Diagnosis not present

## 2022-07-08 DIAGNOSIS — F17210 Nicotine dependence, cigarettes, uncomplicated: Secondary | ICD-10-CM | POA: Diagnosis present

## 2022-07-08 DIAGNOSIS — I255 Ischemic cardiomyopathy: Secondary | ICD-10-CM | POA: Diagnosis not present

## 2022-07-08 DIAGNOSIS — M3500 Sicca syndrome, unspecified: Secondary | ICD-10-CM | POA: Diagnosis present

## 2022-07-08 DIAGNOSIS — I97191 Other postprocedural cardiac functional disturbances following other surgery: Secondary | ICD-10-CM | POA: Diagnosis not present

## 2022-07-08 DIAGNOSIS — L7632 Postprocedural hematoma of skin and subcutaneous tissue following other procedure: Secondary | ICD-10-CM | POA: Diagnosis not present

## 2022-07-08 DIAGNOSIS — I10 Essential (primary) hypertension: Secondary | ICD-10-CM | POA: Diagnosis present

## 2022-07-08 DIAGNOSIS — D6481 Anemia due to antineoplastic chemotherapy: Secondary | ICD-10-CM

## 2022-07-08 DIAGNOSIS — Z20822 Contact with and (suspected) exposure to covid-19: Secondary | ICD-10-CM | POA: Diagnosis present

## 2022-07-08 DIAGNOSIS — E785 Hyperlipidemia, unspecified: Secondary | ICD-10-CM | POA: Diagnosis present

## 2022-07-08 DIAGNOSIS — Z8249 Family history of ischemic heart disease and other diseases of the circulatory system: Secondary | ICD-10-CM

## 2022-07-08 DIAGNOSIS — Z79899 Other long term (current) drug therapy: Secondary | ICD-10-CM

## 2022-07-08 DIAGNOSIS — Z888 Allergy status to other drugs, medicaments and biological substances status: Secondary | ICD-10-CM

## 2022-07-08 DIAGNOSIS — I213 ST elevation (STEMI) myocardial infarction of unspecified site: Principal | ICD-10-CM

## 2022-07-08 DIAGNOSIS — R7309 Other abnormal glucose: Secondary | ICD-10-CM | POA: Diagnosis present

## 2022-07-08 DIAGNOSIS — E1165 Type 2 diabetes mellitus with hyperglycemia: Secondary | ICD-10-CM | POA: Diagnosis present

## 2022-07-08 DIAGNOSIS — D649 Anemia, unspecified: Secondary | ICD-10-CM | POA: Diagnosis present

## 2022-07-08 DIAGNOSIS — Z833 Family history of diabetes mellitus: Secondary | ICD-10-CM

## 2022-07-08 DIAGNOSIS — M329 Systemic lupus erythematosus, unspecified: Secondary | ICD-10-CM | POA: Diagnosis present

## 2022-07-08 DIAGNOSIS — Y840 Cardiac catheterization as the cause of abnormal reaction of the patient, or of later complication, without mention of misadventure at the time of the procedure: Secondary | ICD-10-CM | POA: Diagnosis not present

## 2022-07-08 HISTORY — PX: CORONARY/GRAFT ACUTE MI REVASCULARIZATION: CATH118305

## 2022-07-08 HISTORY — PX: LEFT HEART CATH AND CORONARY ANGIOGRAPHY: CATH118249

## 2022-07-08 LAB — CBC WITH DIFFERENTIAL/PLATELET
Abs Immature Granulocytes: 0.04 K/uL (ref 0.00–0.07)
Basophils Absolute: 0 K/uL (ref 0.0–0.1)
Basophils Relative: 0 %
Eosinophils Absolute: 0 K/uL (ref 0.0–0.5)
Eosinophils Relative: 0 %
HCT: 34.4 % — ABNORMAL LOW (ref 36.0–46.0)
Hemoglobin: 10.8 g/dL — ABNORMAL LOW (ref 12.0–15.0)
Immature Granulocytes: 0 %
Lymphocytes Relative: 14 %
Lymphs Abs: 1.4 K/uL (ref 0.7–4.0)
MCH: 25.1 pg — ABNORMAL LOW (ref 26.0–34.0)
MCHC: 31.4 g/dL (ref 30.0–36.0)
MCV: 80 fL (ref 80.0–100.0)
Monocytes Absolute: 0.5 K/uL (ref 0.1–1.0)
Monocytes Relative: 5 %
Neutro Abs: 8.3 K/uL — ABNORMAL HIGH (ref 1.7–7.7)
Neutrophils Relative %: 81 %
Platelets: 785 K/uL — ABNORMAL HIGH (ref 150–400)
RBC: 4.3 MIL/uL (ref 3.87–5.11)
RDW: 17.4 % — ABNORMAL HIGH (ref 11.5–15.5)
WBC: 10.3 K/uL (ref 4.0–10.5)
nRBC: 0 % (ref 0.0–0.2)

## 2022-07-08 LAB — MAGNESIUM: Magnesium: 1.9 mg/dL (ref 1.7–2.4)

## 2022-07-08 LAB — COMPREHENSIVE METABOLIC PANEL WITH GFR
ALT: 58 U/L — ABNORMAL HIGH (ref 0–44)
AST: 245 U/L — ABNORMAL HIGH (ref 15–41)
Albumin: 3.5 g/dL (ref 3.5–5.0)
Alkaline Phosphatase: 106 U/L (ref 38–126)
Anion gap: 14 (ref 5–15)
BUN: 6 mg/dL (ref 6–20)
CO2: 26 mmol/L (ref 22–32)
Calcium: 9.7 mg/dL (ref 8.9–10.3)
Chloride: 100 mmol/L (ref 98–111)
Creatinine, Ser: 0.61 mg/dL (ref 0.44–1.00)
GFR, Estimated: >60 mL/min
Glucose, Bld: 152 mg/dL — ABNORMAL HIGH (ref 70–99)
Potassium: 3.3 mmol/L — ABNORMAL LOW (ref 3.5–5.1)
Sodium: 140 mmol/L (ref 135–145)
Total Bilirubin: 0.8 mg/dL (ref 0.3–1.2)
Total Protein: 8.6 g/dL — ABNORMAL HIGH (ref 6.5–8.1)

## 2022-07-08 LAB — LIPID PANEL
Cholesterol: 188 mg/dL (ref 0–200)
HDL: 39 mg/dL — ABNORMAL LOW
LDL Cholesterol: 99 mg/dL (ref 0–99)
Total CHOL/HDL Ratio: 4.8 ratio
Triglycerides: 250 mg/dL — ABNORMAL HIGH
VLDL: 50 mg/dL — ABNORMAL HIGH (ref 0–40)

## 2022-07-08 LAB — PROTIME-INR
INR: 1 (ref 0.8–1.2)
Prothrombin Time: 13 s (ref 11.4–15.2)

## 2022-07-08 LAB — TROPONIN I (HIGH SENSITIVITY): Troponin I (High Sensitivity): >24000 ng/L

## 2022-07-08 LAB — APTT: aPTT: 36 s (ref 24–36)

## 2022-07-08 SURGERY — CORONARY/GRAFT ACUTE MI REVASCULARIZATION

## 2022-07-08 MED ORDER — MIDAZOLAM HCL 2 MG/2ML IJ SOLN
INTRAMUSCULAR | Status: AC
  Filled 2022-07-08: qty 2

## 2022-07-08 MED ORDER — VERAPAMIL HCL 2.5 MG/ML IV SOLN
INTRAVENOUS | Status: AC
  Filled 2022-07-08: qty 2

## 2022-07-08 MED ORDER — MIDAZOLAM HCL 2 MG/2ML IJ SOLN
INTRAMUSCULAR | Status: DC | PRN
  Administered 2022-07-08: 1 mg via INTRAVENOUS
  Administered 2022-07-08: 2 mg via INTRAVENOUS

## 2022-07-08 MED ORDER — TIROFIBAN (AGGRASTAT) BOLUS VIA INFUSION
INTRAVENOUS | Status: DC | PRN
  Administered 2022-07-08: 2155 ug via INTRAVENOUS

## 2022-07-08 MED ORDER — IOHEXOL 350 MG/ML SOLN
INTRAVENOUS | Status: DC | PRN
  Administered 2022-07-08: 110 mL

## 2022-07-08 MED ORDER — LIDOCAINE HCL (PF) 1 % IJ SOLN
INTRAMUSCULAR | Status: DC | PRN
  Administered 2022-07-08: 2 mL

## 2022-07-08 MED ORDER — HEPARIN SODIUM (PORCINE) 1000 UNIT/ML IJ SOLN
INTRAMUSCULAR | Status: AC
  Filled 2022-07-08: qty 10

## 2022-07-08 MED ORDER — HEPARIN SODIUM (PORCINE) 5000 UNIT/ML IJ SOLN
4000.0000 [IU] | Freq: Once | INTRAMUSCULAR | Status: AC
  Administered 2022-07-08: 4000 [IU] via INTRAVENOUS

## 2022-07-08 MED ORDER — LIDOCAINE HCL 1 % IJ SOLN
INTRAMUSCULAR | Status: AC
  Filled 2022-07-08: qty 20

## 2022-07-08 MED ORDER — ASPIRIN 81 MG PO CHEW
324.0000 mg | CHEWABLE_TABLET | Freq: Once | ORAL | Status: AC
  Administered 2022-07-08: 324 mg via ORAL

## 2022-07-08 MED ORDER — NITROGLYCERIN 1 MG/10 ML FOR IR/CATH LAB
INTRA_ARTERIAL | Status: DC | PRN
  Administered 2022-07-08 (×2): 200 ug

## 2022-07-08 MED ORDER — TICAGRELOR 90 MG PO TABS
ORAL_TABLET | ORAL | Status: AC
  Filled 2022-07-08: qty 2

## 2022-07-08 MED ORDER — HEPARIN (PORCINE) IN NACL 1000-0.9 UT/500ML-% IV SOLN
INTRAVENOUS | Status: AC
  Filled 2022-07-08: qty 1000

## 2022-07-08 MED ORDER — TICAGRELOR 90 MG PO TABS
ORAL_TABLET | ORAL | Status: DC | PRN
  Administered 2022-07-08: 180 mg via ORAL

## 2022-07-08 MED ORDER — TIROFIBAN HCL IV 12.5 MG/250 ML
INTRAVENOUS | Status: AC
  Filled 2022-07-08: qty 250

## 2022-07-08 MED ORDER — HEPARIN (PORCINE) IN NACL 1000-0.9 UT/500ML-% IV SOLN
INTRAVENOUS | Status: DC | PRN
  Administered 2022-07-08 (×2): 500 mL

## 2022-07-08 MED ORDER — FENTANYL CITRATE (PF) 100 MCG/2ML IJ SOLN
INTRAMUSCULAR | Status: AC
  Filled 2022-07-08: qty 2

## 2022-07-08 MED ORDER — FENTANYL CITRATE (PF) 100 MCG/2ML IJ SOLN
INTRAMUSCULAR | Status: DC | PRN
  Administered 2022-07-08 (×2): 25 ug via INTRAVENOUS

## 2022-07-08 MED ORDER — VERAPAMIL HCL 2.5 MG/ML IV SOLN
INTRAVENOUS | Status: DC | PRN
  Administered 2022-07-08: 10 mL via INTRA_ARTERIAL

## 2022-07-08 MED ORDER — HEPARIN SODIUM (PORCINE) 1000 UNIT/ML IJ SOLN
INTRAMUSCULAR | Status: DC | PRN
  Administered 2022-07-08: 9000 [IU] via INTRAVENOUS

## 2022-07-08 SURGICAL SUPPLY — 19 items
BALLN ~~LOC~~ TREK NEO RX 3.5X12 (BALLOONS) IMPLANT
BAND ZEPHYR COMPRESS 30 LONG (HEMOSTASIS) IMPLANT
CANISTER PENUMBRA ENGINE (MISCELLANEOUS) IMPLANT
CATH INDIGO CAT RX KIT (CATHETERS) IMPLANT
CATH INFINITI 5 FR JL3.5 (CATHETERS) IMPLANT
CATH INFINITI JR4 5F (CATHETERS) IMPLANT
CATH VISTA GUIDE 6FR JR4 (CATHETERS) IMPLANT
DRAPE BRACHIAL (DRAPES) IMPLANT
GLIDESHEATH SLEND SS 6F .021 (SHEATH) IMPLANT
GUIDEWIRE INQWIRE 1.5J.035X260 (WIRE) IMPLANT
INQWIRE 1.5J .035X260CM (WIRE) ×1
KIT ENCORE 26 ADVANTAGE (KITS) IMPLANT
PACK CARDIAC CATH (CUSTOM PROCEDURE TRAY) IMPLANT
PROTECTION STATION PRESSURIZED (MISCELLANEOUS) ×1
SET ATX SIMPLICITY (MISCELLANEOUS) IMPLANT
STATION PROTECTION PRESSURIZED (MISCELLANEOUS) IMPLANT
STENT ONYX FRONTIER 3.0X18 (Permanent Stent) IMPLANT
VALVE COPILOT STAT (MISCELLANEOUS) IMPLANT
WIRE ASAHI PROWATER 180CM (WIRE) IMPLANT

## 2022-07-08 NOTE — Progress Notes (Signed)
Chaplain responded to code stemi. Chaplain offered compassionate presence. Family member present at bedside. Please contact Chaplain if requested.

## 2022-07-08 NOTE — ED Provider Notes (Signed)
[facility] Provider Note    Event Date/Time   First MD Initiated Contact with Patient 07/08/22 2234     (approximate)   History   Chief Complaint: Shortness of Breath, Chest Pain, and Code STEMI   HPI  Kristin Finley is a 51 y.o. female with a history of lupus who comes the ED complaining of chest pain that started yesterday afternoon, intermittent, associated with diaphoresis and shortness of breath, radiating to the neck and feels like heaviness.  Today at around 3 PM, the pain became much more intense and constant.  It has been unremitting since then.  Patient denies any possibility of pregnancy.  Patient notes that she has been having intermittent symptoms of pneumonia and bronchitis for the past several weeks.  She has seen her primary care doctor for this but symptoms have not improved.  However, the symptoms of the last 24 hours seem new and different.     Physical Exam   Triage Vital Signs: ED Triage Vitals [07/08/22 2226]  Enc Vitals Group     BP 137/73     Pulse Rate 84     Resp 18     Temp 97.9 F (36.6 C)     Temp Source Oral     SpO2 100 %     Weight 190 lb (86.2 kg)     Height 5\' 7"  (1.702 m)     Head Circumference      Peak Flow      Pain Score 8     Pain Loc      Pain Edu?      Excl. in [place]?     Most recent vital signs: Vitals:   07/08/22 2226  BP: 137/73  Pulse: 84  Resp: 18  Temp: 97.9 F (36.6 C)  SpO2: 100%    General: Awake, no distress.  CV:  Good peripheral perfusion.  Symmetric distal pulses.  Regular rate and rhythm. Resp:  Normal effort.  Clear to auscultation bilaterally Abd:  No distention.  Soft nontender Other:  No lower extremity edema, no calf tenderness.   ED Results / Procedures / Treatments   Labs (all labs ordered are listed, but only abnormal results are displayed) Labs Reviewed  CBC WITH DIFFERENTIAL/PLATELET - Abnormal; Notable for the following components:      Result Value   Hemoglobin  10.8 (*)    HCT 34.4 (*)    MCH 25.1 (*)    RDW 17.4 (*)    Platelets 785 (*)    Neutro Abs 8.3 (*)    All other components within normal limits  RESP PANEL BY RT-PCR (FLU A&B, COVID) ARPGX2  HEMOGLOBIN A1C  PROTIME-INR  APTT  COMPREHENSIVE METABOLIC PANEL  LIPID PANEL  MAGNESIUM  BRAIN NATRIURETIC PEPTIDE  URINALYSIS, ROUTINE W REFLEX MICROSCOPIC  POC URINE PREG, ED  TROPONIN I (HIGH SENSITIVITY)     EKG Interpreted by me Sinus rhythm, rate of 78.  Normal axis, normal intervals.  Poor R wave progression.  ST elevation in the inferior leads with reciprocal ST depression in the high lateral leads, consistent with STEMI.  Compared to prior EKG available from 10/07/2017, ST changes are new  RADIOLOGY Chest x-ray interpreted by me, shows increased opacity in the left lower lung.  Radiology report reviewed.   PROCEDURES:  .Critical Care  Performed by: Carrie Mew, MD Authorized by: Carrie Mew, MD   Critical care provider statement:    Critical care time (minutes):  30  Critical care time was exclusive of:  Separately billable procedures and treating other patients   Critical care was necessary to treat or prevent imminent or life-threatening deterioration of the following conditions:  Cardiac failure   Critical care was time spent personally by me on the following activities:  Development of treatment plan with patient or surrogate, discussions with consultants, evaluation of patient's response to treatment, examination of patient, obtaining history from patient or surrogate, ordering and performing treatments and interventions, ordering and review of laboratory studies, ordering and review of radiographic studies, pulse oximetry, re-evaluation of patient's condition and review of old charts    MEDICATIONS ORDERED IN ED: Medications  aspirin chewable tablet 324 mg (324 mg Oral Given 07/08/22 2237)  heparin injection 4,000 Units (4,000 Units Intravenous Given  07/08/22 2239)     IMPRESSION / MDM / ASSESSMENT AND PLAN / ED COURSE  I reviewed the triage vital signs and the nursing notes.                              Differential diagnosis includes, but is not limited to, STEMI, pneumonia, pleurisy, pericarditis, pneumothorax  Patient's presentation is most consistent with acute presentation with potential threat to life or bodily function.  Patient presents with chest pain and shortness of breath.  Initial EKG is concerning for STEMI.  Code STEMI activated.  Will give aspirin and heparin bolus.   Clinical Course as of 07/08/22 2251  [name] Jul 08, 2022  2234 D/w cards Dr. Eldridge Dace who agrees with STEMI and will proceed with cath lab activation.  [PS]    Clinical Course User Index [PS] Sharman Cheek, MD    ----------------------------------------- 10:49 PM on 07/08/2022 ----------------------------------------- Chest x-ray shows opacities in left lower lung, possibly asymmetric edema versus infectious.  We will follow-up cardiology recommendations.   FINAL CLINICAL IMPRESSION(S) / ED DIAGNOSES   Final diagnoses:  ST elevation myocardial infarction (STEMI), unspecified artery (HCC)  Lupus (HCC)  Infiltrate of left lung present on chest x-ray     Rx / DC Orders   ED Discharge Orders     None        Note:  This document was prepared using Dragon voice recognition software and may include unintentional dictation errors.   Sharman Cheek, MD 07/08/22 2252

## 2022-07-08 NOTE — ED Notes (Signed)
Dr. Stafford at bedside at this time.  

## 2022-07-08 NOTE — ED Triage Notes (Signed)
Pt arrives via POV from home with CC of SOB and CP that began on 06/24/2022. Pt reports L sided CP is reproducible with inspiration.

## 2022-07-08 NOTE — Consult Note (Signed)
Cardiology Consultation   Patient ID: Kristin Finley MRN: [id number]; DOB: 1970-12-21  Admit date: 07/08/2022 Date of Consult: 07/09/2022  PCP:  Armando Gang, FNP   [facility] Providers Cardiologist:  None        Patient Profile:   Kristin Finley is a 51 y.o. female with a hx of lupus who is being seen 07/09/2022 for the evaluation of chest pain at the request of Dr. Scotty Court.  History of Present Illness:   Kristin Finley has a family history of heart disease.  Her mother has had a lower extremity intervention done.  She has had on and off chest heaviness associated with sweating and shortness of breath over the past 24 hours.  Today at around 3:00, it became more constant.  It has not stopped since that time.  Of note, she was treated for pneumonia a couple of weeks ago by her primary care doctor.  Symptoms have not improved.  Currently, the pain has improved and is about a 5 out of 10.  At worst it was about an 8 out of 10.   Past Medical History:  Diagnosis Date   History of systemic lupus erythematosus (SLE) (HCC)    Kidney stone    Sjogren's syndrome [facility])     Past Surgical History:  Procedure Laterality Date   BARTHOLIN GLAND CYST EXCISION  10/2016   lithotripsy Left    TUBAL LIGATION Bilateral        Inpatient Medications: Scheduled Meds:  Continuous Infusions:  PRN Meds:   Allergies:    Allergies  Allergen Reactions   Wellbutrin [Bupropion] Hives, Itching and Rash   Codeine Hives and Rash    Social History:   Social History   Socioeconomic History   Marital status: Married    Spouse name: Not on file   Number of children: Not on file   Years of education: Not on file   Highest education level: Not on file  Occupational History   Not on file  Tobacco Use   Smoking status: Every Day    Types: Cigarettes   Smokeless tobacco: Never  Substance and Sexual Activity   Alcohol use: No   Drug use: No   Sexual activity: Yes    Birth  control/protection: None, Surgical  Other Topics Concern   Not on file  Social History Narrative   Not on file   Social Determinants of Health   Financial Resource Strain: Not on file  Food Insecurity: Not on file  Transportation Needs: Not on file  Physical Activity: Not on file  Stress: Not on file  Social Connections: Not on file  Intimate Partner Violence: Not on file    Family History:    Family History  Problem Relation Age of Onset   Cancer Father    Diabetes Sister      ROS:  Please see the history of present illness.  Chest discomfort All other ROS reviewed and negative.     Physical Exam/Data:   Vitals:   07/08/22 2226 07/08/22 2305 07/08/22 2319  BP: 137/73 (!) 152/80   Pulse: 84 82   Resp: 18 20   Temp: 97.9 F (36.6 C)    TempSrc: Oral    SpO2: 100% 100% 100%  Weight: 86.2 kg    Height: 5\' 7"  (1.702 m)     No intake or output data in the 24 hours ending 07/09/22 0028    07/08/2022   10:26 PM 01/11/2018   11:23 AM  10/06/2017    8:14 PM  Last 3 Weights  Weight (lbs) 190 lb 190 lb 200 lb  Weight (kg) 86.183 kg 86.183 kg 90.719 kg     Body mass index is 29.76 kg/m.  General:  Well nourished, well developed, in no acute distress HEENT: normal Neck: no JVD Vascular: No carotid bruits; Distal pulses 2+ bilaterally Cardiac:   RRR;  Lungs:  clear to auscultation bilaterally, no wheezing, rhonchi or rales  Abd: soft, nontender, no hepatomegaly  Ext: no edema Musculoskeletal:  No deformities, BUE and BLE strength normal and equal; 2+ right radial pulse, palpable ulnar pulse on the right wrist as well.  2+ right posterior tibial pulse Skin: warm and dry  Neuro:  CNs 2-12 intact, no focal abnormalities noted Psych:  Normal affect   EKG:  The EKG was personally reviewed and demonstrates: Normal sinus rhythm with inferolateral ST elevations, significant T wave inversions noted inferiorly as well. Telemetry:  Telemetry was personally reviewed and  demonstrates: Normal sinus rhythm  Relevant CV Studies:   Laboratory Data:  High Sensitivity Troponin:   Recent Labs  Lab 07/08/22 2230  TROPONINIHS >24,000*     Chemistry Recent Labs  Lab 07/08/22 2230  NA 140  K 3.3*  CL 100  CO2 26  GLUCOSE 152*  BUN 6  CREATININE 0.61  CALCIUM 9.7  MG 1.9  GFRNONAA >60  ANIONGAP 14    Recent Labs  Lab 07/08/22 2230  PROT 8.6*  ALBUMIN 3.5  AST 245*  ALT 58*  ALKPHOS 106  BILITOT 0.8   Lipids  Recent Labs  Lab 07/08/22 2230  CHOL 188  TRIG 250*  HDL 39*  LDLCALC 99  CHOLHDL 4.8    Hematology Recent Labs  Lab 07/08/22 2230  WBC 10.3  RBC 4.30  HGB 10.8*  HCT 34.4*  MCV 80.0  MCH 25.1*  MCHC 31.4  RDW 17.4*  PLT 785*   Thyroid No results for input(s): "TSH", "FREET4" in the last 168 hours.  BNPNo results for input(s): "BNP", "PROBNP" in the last 168 hours.  DDimer No results for input(s): "DDIMER" in the last 168 hours.   Radiology/Studies:  CARDIAC CATHETERIZATION  Result Date: 07/09/2022   Dist LAD lesion is 50% stenosed.   Mid LAD lesion is 25% stenosed.   Mid RCA lesion is 100% stenosed.  After aspiration thrombectomy, a drug-eluting stent was successfully placed using a STENT ONYX FRONTIER 3.0X18, postdilated to 3.5 mm.   Post intervention, there is a 0% residual stenosis.   Overall, the left ventricular systolic function is normal. Basal to mid inferior hypokinesis.   LV end diastolic pressure is mildly elevated.   The left ventricular ejection fraction is 55-65% by visual estimate.   There is no aortic valve stenosis. 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  Successful PCI of the RCA with single stent placement.  The patient tolerated the procedure well.  Results were conveyed to the son. Of note, post procedure, she developed a right forearm hematoma.  Blood pressure cuff was applied in the ICU. She will need dual antiplatelet therapy, beta-blocker, high-dose statin.   She will need to stop smoking.   DG Chest Port 1 View  Result Date: 07/08/2022 CLINICAL DATA:  code stemi EXAM: PORTABLE CHEST 1 VIEW COMPARISON:  None Available. FINDINGS: Cardiac paddles overlie the chest. The heart and mediastinal contours are within normal limits. Interval development of left lung patchy airspace opacities. No pulmonary edema. No definite  pleural effusion. No pneumothorax. No acute osseous abnormality. IMPRESSION: Interval development of left lung patchy airspace opacities. Electronically Signed   By: Tish Frederickson M.D.   On: 07/08/2022 22:47     Assessment and Plan:   Acute inferolateral ST elevation MI.  I have personally reviewed the ECG and made the decision for cardiac catheterization emergently.  Further plans will be based on the cath result. Long-term, she will need lipid-lowering therapy.  She already reports that she eats a vegetarian diet.  She does admit she can increase her exercise.  Will discuss lifestyle improvements along with medical therapy.   Cath showed: Dist LAD lesion is 50% stenosed.   Mid LAD lesion is 25% stenosed.   Mid RCA lesion is 100% stenosed.  After aspiration thrombectomy, a drug-eluting stent was successfully placed using a STENT ONYX FRONTIER 3.0X18, postdilated to 3.5 mm.   Post intervention, there is a 0% residual stenosis.   Overall, the left ventricular systolic function is normal. Basal to mid inferior hypokinesis.   LV end diastolic pressure is mildly elevated.   The left ventricular ejection fraction is 55-65% by visual estimate.   There is no aortic valve stenosis.   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  Successful PCI of the RCA with single stent placement.  The patient tolerated the procedure well.  Results were conveyed to the son.   Of note, post procedure, she developed a right forearm hematoma.  Blood pressure cuff was applied in the ICU.   She will need dual antiplatelet therapy,  beta-blocker, high-dose statin.  She will need to stop smoking.   Risk Assessment/Risk Scores:     TIMI Risk Score for ST  Elevation MI:   The patient's TIMI risk score is 4, which indicates a 7.3% risk of all cause mortality at 30 days.             For questions or updates, please contact [facility] Please consult [email or web address] for contact info under    Signed, Lance Muss, MD  07/09/2022 12:28 AM

## 2022-07-08 NOTE — ED Notes (Signed)
Trop >24,000 per lab EDP [place] notified.

## 2022-07-09 ENCOUNTER — Inpatient Hospital Stay: Payer: Self-pay

## 2022-07-09 ENCOUNTER — Encounter: Payer: Self-pay | Admitting: Interventional Cardiology

## 2022-07-09 ENCOUNTER — Inpatient Hospital Stay (HOSPITAL_COMMUNITY)
Admit: 2022-07-09 | Discharge: 2022-07-09 | Disposition: A | Discharge status: Home / Self Care | Payer: Self-pay | Attending: Interventional Cardiology | Admitting: Interventional Cardiology

## 2022-07-09 ENCOUNTER — Inpatient Hospital Stay
Admit: 2022-07-09 | Discharge: 2022-07-09 | Disposition: A | Discharge status: Home / Self Care | Payer: Self-pay | Attending: Interventional Cardiology | Admitting: Interventional Cardiology

## 2022-07-09 ENCOUNTER — Telehealth: Payer: Self-pay | Admitting: *Deleted

## 2022-07-09 DIAGNOSIS — I2119 ST elevation (STEMI) myocardial infarction involving other coronary artery of inferior wall: Secondary | ICD-10-CM

## 2022-07-09 DIAGNOSIS — D649 Anemia, unspecified: Secondary | ICD-10-CM | POA: Diagnosis present

## 2022-07-09 DIAGNOSIS — D6481 Anemia due to antineoplastic chemotherapy: Secondary | ICD-10-CM

## 2022-07-09 DIAGNOSIS — E876 Hypokalemia: Secondary | ICD-10-CM | POA: Diagnosis present

## 2022-07-09 DIAGNOSIS — R7309 Other abnormal glucose: Secondary | ICD-10-CM | POA: Diagnosis present

## 2022-07-09 DIAGNOSIS — I10 Essential (primary) hypertension: Secondary | ICD-10-CM | POA: Diagnosis present

## 2022-07-09 DIAGNOSIS — E785 Hyperlipidemia, unspecified: Secondary | ICD-10-CM | POA: Diagnosis present

## 2022-07-09 DIAGNOSIS — R7989 Other specified abnormal findings of blood chemistry: Secondary | ICD-10-CM | POA: Diagnosis present

## 2022-07-09 LAB — CBC
HCT: 29.5 % — ABNORMAL LOW (ref 36.0–46.0)
Hemoglobin: 9.2 g/dL — ABNORMAL LOW (ref 12.0–15.0)
MCH: 25.1 pg — ABNORMAL LOW (ref 26.0–34.0)
MCHC: 31.2 g/dL (ref 30.0–36.0)
MCV: 80.6 fL (ref 80.0–100.0)
Platelets: 693 10*3/uL — ABNORMAL HIGH (ref 150–400)
RBC: 3.66 MIL/uL — ABNORMAL LOW (ref 3.87–5.11)
RDW: 17.3 % — ABNORMAL HIGH (ref 11.5–15.5)
WBC: 11.5 10*3/uL — ABNORMAL HIGH (ref 4.0–10.5)
nRBC: 0 % (ref 0.0–0.2)

## 2022-07-09 LAB — ECHOCARDIOGRAM COMPLETE
AR max vel: 2.61 cm2
AV Area VTI: 2.7 cm2
AV Area mean vel: 2.64 cm2
AV Mean grad: 5 mmHg
AV Peak grad: 9.3 mmHg
Ao pk vel: 1.53 m/s
Area-P 1/2: 4.49 cm2
Height: 67 in
P 1/2 time: 488 msec
S' Lateral: 2.7 cm
Weight: 3040 oz

## 2022-07-09 LAB — BASIC METABOLIC PANEL
Anion gap: 10 (ref 5–15)
BUN: 7 mg/dL (ref 6–20)
CO2: 26 mmol/L (ref 22–32)
Calcium: 8.4 mg/dL — ABNORMAL LOW (ref 8.9–10.3)
Chloride: 104 mmol/L (ref 98–111)
Creatinine, Ser: 0.56 mg/dL (ref 0.44–1.00)
GFR, Estimated: >60 mL/min (ref >60)
Glucose, Bld: 152 mg/dL — ABNORMAL HIGH (ref 70–99)
Potassium: 3 mmol/L — ABNORMAL LOW (ref 3.5–5.1)
Sodium: 140 mmol/L (ref 135–145)

## 2022-07-09 LAB — POCT ACTIVATED CLOTTING TIME
Activated Clotting Time: 305 seconds
Activated Clotting Time: 558 seconds

## 2022-07-09 LAB — RESP PANEL BY RT-PCR (FLU A&B, COVID) ARPGX2
Influenza A by PCR: NEGATIVE
Influenza B by PCR: NEGATIVE
SARS Coronavirus 2 by RT PCR: NEGATIVE

## 2022-07-09 LAB — HEPATITIS PANEL, ACUTE
HCV Ab: NONREACTIVE
Hep A IgM: NONREACTIVE
Hep B C IgM: NONREACTIVE
Hepatitis B Surface Ag: NONREACTIVE

## 2022-07-09 LAB — TSH: TSH: 3.375 u[IU]/mL (ref 0.350–4.500)

## 2022-07-09 LAB — T4, FREE: Free T4: 0.86 ng/dL (ref 0.61–1.12)

## 2022-07-09 LAB — HEMOGLOBIN A1C
Hgb A1c MFr Bld: 5.9 % — ABNORMAL HIGH (ref 4.8–5.6)
Mean Plasma Glucose: 122.63 mg/dL

## 2022-07-09 LAB — MRSA NEXT GEN BY PCR, NASAL: MRSA by PCR Next Gen: NOT DETECTED

## 2022-07-09 LAB — BRAIN NATRIURETIC PEPTIDE: B Natriuretic Peptide: 468 pg/mL — ABNORMAL HIGH (ref 0.0–100.0)

## 2022-07-09 LAB — GLUCOSE, CAPILLARY: Glucose-Capillary: 139 mg/dL — ABNORMAL HIGH (ref 70–99)

## 2022-07-09 MED ORDER — SODIUM CHLORIDE 0.9 % IV SOLN: 250.0000 mL | INTRAVENOUS | Status: DC | PRN

## 2022-07-09 MED ORDER — ROSUVASTATIN CALCIUM 10 MG PO TABS
40.0000 mg | ORAL_TABLET | Freq: Every day | ORAL | Status: DC
  Administered 2022-07-09 – 2022-07-10 (×2): 40 mg via ORAL
  Filled 2022-07-09 (×2): qty 4

## 2022-07-09 MED ORDER — POTASSIUM CHLORIDE CRYS ER 20 MEQ PO TBCR
40.0000 meq | EXTENDED_RELEASE_TABLET | Freq: Three times a day (TID) | ORAL | Status: DC
  Administered 2022-07-09: 40 meq via ORAL
  Filled 2022-07-09: qty 2

## 2022-07-09 MED ORDER — ACETAMINOPHEN 500 MG PO TABS: 500.0000 mg | ORAL_TABLET | Freq: Four times a day (QID) | ORAL | Status: DC | PRN

## 2022-07-09 MED ORDER — ONDANSETRON HCL 4 MG/2ML IJ SOLN: 4.0000 mg | Freq: Four times a day (QID) | INTRAMUSCULAR | Status: DC | PRN

## 2022-07-09 MED ORDER — ASPIRIN 81 MG PO CHEW
81.0000 mg | CHEWABLE_TABLET | Freq: Every day | ORAL | Status: DC
  Administered 2022-07-09 – 2022-07-10 (×2): 81 mg via ORAL
  Filled 2022-07-09 (×2): qty 1

## 2022-07-09 MED ORDER — CHLORHEXIDINE GLUCONATE CLOTH 2 % EX PADS
6.0000 | MEDICATED_PAD | Freq: Every day | CUTANEOUS | Status: DC
  Administered 2022-07-09: 6 via TOPICAL

## 2022-07-09 MED ORDER — POTASSIUM CHLORIDE CRYS ER 20 MEQ PO TBCR
40.0000 meq | EXTENDED_RELEASE_TABLET | Freq: Once | ORAL | Status: AC
  Administered 2022-07-09: 40 meq via ORAL
  Filled 2022-07-09: qty 2

## 2022-07-09 MED ORDER — AMLODIPINE BESYLATE 10 MG PO TABS: 10.0000 mg | ORAL_TABLET | Freq: Every day | ORAL | Status: DC

## 2022-07-09 MED ORDER — LISINOPRIL 5 MG PO TABS: 5.0000 mg | ORAL_TABLET | Freq: Every day | ORAL | Status: DC

## 2022-07-09 MED ORDER — METOPROLOL TARTRATE 25 MG PO TABS
12.5000 mg | ORAL_TABLET | Freq: Two times a day (BID) | ORAL | Status: DC
  Administered 2022-07-09: 12.5 mg via ORAL
  Filled 2022-07-09 (×2): qty 1

## 2022-07-09 MED ORDER — LABETALOL HCL 5 MG/ML IV SOLN: 10.0000 mg | INTRAVENOUS | Status: DC | PRN

## 2022-07-09 MED ORDER — HYDRALAZINE HCL 20 MG/ML IJ SOLN: 10.0000 mg | INTRAMUSCULAR | Status: DC | PRN

## 2022-07-09 MED ORDER — SODIUM CHLORIDE 0.9 % IV SOLN: INTRAVENOUS | Status: DC

## 2022-07-09 MED ORDER — SODIUM CHLORIDE 0.9% FLUSH: 3.0000 mL | INTRAVENOUS | Status: DC | PRN

## 2022-07-09 MED ORDER — POTASSIUM CHLORIDE 10 MEQ/100ML IV SOLN
10.0000 meq | INTRAVENOUS | Status: DC
  Filled 2022-07-09 (×4): qty 100

## 2022-07-09 MED ORDER — SODIUM CHLORIDE 0.9% FLUSH
3.0000 mL | Freq: Two times a day (BID) | INTRAVENOUS | Status: DC
  Administered 2022-07-09 – 2022-07-10 (×3): 3 mL via INTRAVENOUS

## 2022-07-09 MED ORDER — VENLAFAXINE HCL ER 75 MG PO CP24
75.0000 mg | ORAL_CAPSULE | Freq: Every day | ORAL | Status: DC
  Administered 2022-07-09 – 2022-07-10 (×2): 75 mg via ORAL
  Filled 2022-07-09 (×2): qty 1

## 2022-07-09 MED ORDER — LORAZEPAM 1 MG PO TABS
0.5000 mg | ORAL_TABLET | Freq: Once | ORAL | Status: AC
  Administered 2022-07-09: 0.5 mg via ORAL
  Filled 2022-07-09: qty 1

## 2022-07-09 MED ORDER — ACETAMINOPHEN 325 MG PO TABS
650.0000 mg | ORAL_TABLET | ORAL | Status: DC | PRN
  Administered 2022-07-10: 650 mg via ORAL
  Filled 2022-07-09: qty 2

## 2022-07-09 MED ORDER — TICAGRELOR 90 MG PO TABS
90.0000 mg | ORAL_TABLET | Freq: Two times a day (BID) | ORAL | Status: DC
  Administered 2022-07-09 – 2022-07-10 (×3): 90 mg via ORAL
  Filled 2022-07-09 (×2): qty 1

## 2022-07-09 MED ORDER — MELATONIN 5 MG PO TABS
5.0000 mg | ORAL_TABLET | Freq: Once | ORAL | Status: AC
  Administered 2022-07-09: 5 mg via ORAL
  Filled 2022-07-09: qty 1

## 2022-07-09 NOTE — Assessment & Plan Note (Signed)
CBC    Component Value Date/Time   WBC 10.3 07/08/2022 2230   RBC 4.30 07/08/2022 2230   HGB 10.8 (L) 07/08/2022 2230   HGB 13.8 04/19/2012 1106   HCT 34.4 (L) 07/08/2022 2230   HCT 40.6 04/19/2012 1106   PLT 785 (H) 07/08/2022 2230   PLT 208 04/19/2012 1106   MCV 80.0 07/08/2022 2230   MCV 90 04/19/2012 1106   MCH 25.1 (L) 07/08/2022 2230   MCHC 31.4 07/08/2022 2230   RDW 17.4 (H) 07/08/2022 2230   RDW 13.7 04/19/2012 1106   LYMPHSABS 1.4 07/08/2022 2230   MONOABS 0.5 07/08/2022 2230   EOSABS 0.0 07/08/2022 2230   BASOSABS 0.0 07/08/2022 2230   Patient states that she does get periods and they are heavy.  No conjunctival pallor. We will follow.

## 2022-07-09 NOTE — Assessment & Plan Note (Addendum)
Pt found to have inferior STEMI.  We will continue with antiplatelet and statin therapy per cards recommendation.  Cardiology managing.  Pt continued on brilinta and aspirin.  Holding amlodipine /lisinopril due to hypotension.  We will monitor closely in ICU.

## 2022-07-09 NOTE — Telephone Encounter (Signed)
Patient currently admitted.  To scheduling to arrange for a follow up.  Nursing to review on 07/10/22 for 1st attempt TOC call.

## 2022-07-09 NOTE — Assessment & Plan Note (Signed)
Suspect patient has underlying diabetes mellitus type 2. We will obtain an A1c and monitor. Heart healthy diet in the meantime.

## 2022-07-09 NOTE — Progress Notes (Signed)
At 0300 Patient called out requesting something for sleep, asked what she does at home if she has problems with sleep, she states that she will take a hot bath or take benadryl. Notified Foust NP and received order for 5mg  melatonin, administered melatonin with little effect, notified [facility] NP again that patient is not able to stay asleep and is waking up feeling like she is having an anxiety attack. I reminded patient that she had been through a terrible ordeal, and that it is normal to have a stress response, received order from [facility] NP for a one time dose of Ativan 0.5 mg that worked Recruitment consultant.

## 2022-07-09 NOTE — Progress Notes (Signed)
*  PRELIMINARY RESULTS* Echocardiogram 2D Echocardiogram has been performed.  Kristin Finley 07/09/2022, 2:27 PM

## 2022-07-09 NOTE — H&P (Addendum)
History and Physical    Chief Complaint: Chest pain   HISTORY OF PRESENT ILLNESS: Kristin Finley is an 51 y.o. female comes to the hospital in private vehicle with complaint of chest pain and shortness of breath has been going on since 17 September. Patient reports left-sided chest pain shortness of breath orthopnea intermittently in the past with leg swelling. Patient has been going to primary care thinking she has an upper respiratory and possibly COVID which were negative. On arrival to the emergency room patient was found elevation in inferior leads with ST depression in the lateral leads consistent with STEMI and code STEMI was called.  Patient was taken emergently to Cath Lab.  Patient was given an aspirin and started on heparin drip. On-call STEMI cardiologist Dr. Irish Lack  for Acute inferolateral ST elevation MI due to occluded right coronary artery.  Successful PCI of the RCA with single stent placement. Pt has a BP cuff around her right forearm to prevent swelling from hematoma and cardiology managing.   Pt has PMH as below: Past Medical History:  Diagnosis Date   History of systemic lupus erythematosus (SLE) ([place])    Kidney stone    Sjogren's syndrome ([place])     Review Of Systems: Review of Systems  Respiratory:  Positive for shortness of breath.   Cardiovascular:  Positive for chest pain.  All other systems reviewed and are negative.    ALLERGIES: Allergies  Allergen Reactions   Wellbutrin [Bupropion] Hives, Itching and Rash   Codeine Hives and Rash    PAST SURGICAL HISTORY: Past Surgical History:  Procedure Laterality Date   BARTHOLIN GLAND CYST EXCISION  10/2016   lithotripsy Left    TUBAL LIGATION Bilateral      SOCIAL HISTORY: Social History   Socioeconomic History   Marital status: Married    Spouse name: Not on file   Number of children: Not on file   Years of education: Not on file   Highest education level: Not on file  Occupational History    Not on file  Tobacco Use   Smoking status: Every Day    Types: Cigarettes   Smokeless tobacco: Never  Substance and Sexual Activity   Alcohol use: No   Drug use: No   Sexual activity: Yes    Birth control/protection: None, Surgical  Other Topics Concern   Not on file  Social History Narrative   Not on file   Social Determinants of Health   Financial Resource Strain: Not on file  Food Insecurity: Not on file  Transportation Needs: Not on file  Physical Activity: Not on file  Stress: Not on file  Social Connections: Not on file      CURRENT MEDS:    Current Facility-Administered Medications (Cardiovascular):    hydrALAZINE (APRESOLINE) injection 10 mg   labetalol (NORMODYNE) injection 10 mg   metoprolol tartrate (LOPRESSOR) tablet 12.5 mg   rosuvastatin (CRESTOR) tablet 40 mg     Current Facility-Administered Medications (Analgesics):    acetaminophen (TYLENOL) tablet 650 mg   aspirin chewable tablet 81 mg   Current Facility-Administered Medications (Hematological):    ticagrelor (BRILINTA) tablet 90 mg   Current Facility-Administered Medications (Other):    0.9 %  sodium chloride infusion   0.9 %  sodium chloride infusion   ondansetron (ZOFRAN) injection 4 mg   sodium chloride flush (NS) 0.9 % injection 3 mL   sodium chloride flush (NS) 0.9 % injection 3 mL   venlafaxine XR (EFFEXOR-XR) 24 hr capsule  75 mg  No current outpatient medications on file.    ED Course: Pt in icu is A/O and afebrile. Vitals:   07/09/22 0025 07/09/22 0030 07/09/22 0035 07/09/22 0040  BP:      Pulse:   66 70  Resp: (!) 25 (!) 22 15 (!) 22  Temp:      TempSrc:      SpO2:   93% 99%  Weight:      Height:       No intake/output data recorded. SpO2: 99 % O2 Flow Rate (L/min): 2 L/min Blood work in ed shows anemia with hb of 10.9 and glucose 152, transaminitis and [place]emia [place] 027[address]. Results for orders placed or performed during the hospital encounter of 07/08/22  (from the past 24 hour(s))  Resp Panel by RT-PCR (Flu A&B, Covid) Anterior Nasal Swab     Status: None   Collection Time: 07/08/22 10:30 PM   Specimen: Anterior Nasal Swab  Result Value Ref Range   SARS Coronavirus 2 by RT PCR NEGATIVE NEGATIVE   Influenza A by PCR NEGATIVE NEGATIVE   Influenza B by PCR NEGATIVE NEGATIVE  CBC with Differential/Platelet     Status: Abnormal   Collection Time: 07/08/22 10:30 PM  Result Value Ref Range   WBC 10.3 4.0 - 10.5 K/uL   RBC 4.30 3.87 - 5.11 MIL/uL   Hemoglobin 10.8 (L) 12.0 - 15.0 g/dL   HCT 12.8 (L) 78.6 - 76.7 %   MCV 80.0 80.0 - 100.0 fL   MCH 25.1 (L) 26.0 - 34.0 pg   MCHC 31.4 30.0 - 36.0 g/dL   RDW 20.9 (H) 47.0 - 96.2 %   Platelets 785 (H) 150 - 400 K/uL   nRBC 0.0 0.0 - 0.2 %   Neutrophils Relative % 81 %   Neutro Abs 8.3 (H) 1.7 - 7.7 K/uL   Lymphocytes Relative 14 %   Lymphs Abs 1.4 0.7 - 4.0 K/uL   Monocytes Relative 5 %   Monocytes Absolute 0.5 0.1 - 1.0 K/uL   Eosinophils Relative 0 %   Eosinophils Absolute 0.0 0.0 - 0.5 K/uL   Basophils Relative 0 %   Basophils Absolute 0.0 0.0 - 0.1 K/uL   Immature Granulocytes 0 %   Abs Immature Granulocytes 0.04 0.00 - 0.07 K/uL  Protime-INR     Status: None   Collection Time: 07/08/22 10:30 PM  Result Value Ref Range   Prothrombin Time 13.0 11.4 - 15.2 seconds   INR 1.0 0.8 - 1.2  APTT     Status: None   Collection Time: 07/08/22 10:30 PM  Result Value Ref Range   aPTT 36 24 - 36 seconds  Comprehensive metabolic panel     Status: Abnormal   Collection Time: 07/08/22 10:30 PM  Result Value Ref Range   Sodium 140 135 - 145 mmol/L   Potassium 3.3 (L) 3.5 - 5.1 mmol/L   Chloride 100 98 - 111 mmol/L   CO2 26 22 - 32 mmol/L   Glucose, Bld 152 (H) 70 - 99 mg/dL   BUN 6 6 - 20 mg/dL   Creatinine, Ser 8.36 0.44 - 1.00 mg/dL   Calcium 9.7 8.9 - 62.9 mg/dL   Total Protein 8.6 (H) 6.5 - 8.1 g/dL   Albumin 3.5 3.5 - 5.0 g/dL   AST 476 (H) 15 - 41 U/L   ALT 58 (H) 0 - 44 U/L    Alkaline Phosphatase 106 38 - 126 U/L   Total  Bilirubin 0.8 0.3 - 1.2 mg/dL   GFR, Estimated >16>60 >10>60 mL/min   Anion gap 14 5 - 15  Troponin I (High Sensitivity)     Status: Abnormal   Collection Time: 07/08/22 10:30 PM  Result Value Ref Range   Troponin I (High Sensitivity) >24,000 (HH) <18 ng/L  Lipid panel     Status: Abnormal   Collection Time: 07/08/22 10:30 PM  Result Value Ref Range   Cholesterol 188 0 - 200 mg/dL   Triglycerides 960250 (H) <150 mg/dL   HDL 39 (L) >45>40 mg/dL   Total CHOL/HDL Ratio 4.8 RATIO   VLDL 50 (H) 0 - 40 mg/dL   LDL Cholesterol 99 0 - 99 mg/dL  Magnesium     Status: None   Collection Time: 07/08/22 10:30 PM  Result Value Ref Range   Magnesium 1.9 1.7 - 2.4 mg/dL  POCT Activated clotting time     Status: None   Collection Time: 07/08/22 11:44 PM  Result Value Ref Range   Activated Clotting Time 558 seconds  POCT Activated clotting time     Status: None   Collection Time: 07/08/22 11:55 PM  Result Value Ref Range   Activated Clotting Time 305 seconds  Glucose, capillary     Status: Abnormal   Collection Time: 07/09/22 12:25 AM  Result Value Ref Range   Glucose-Capillary 139 (H) 70 - 99 mg/dL   In Ed pt received  Meds ordered this encounter  Medications   aspirin chewable tablet 324 mg   heparin injection 4,000 Units   DISCONTD: lidocaine (PF) (XYLOCAINE) 1 % injection   DISCONTD: Heparin (Porcine) in NaCl 1000-0.9 UT/500ML-% SOLN   DISCONTD: fentaNYL (SUBLIMAZE) injection   DISCONTD: midazolam (VERSED) injection   DISCONTD: Radial Cocktail/Verapamil only   DISCONTD: heparin sodium (porcine) injection   DISCONTD: tirofiban (AGGRASTAT) bolus via infusion   DISCONTD: nitroGLYCERIN 1 mg/10 mL (100 mcg/mL) - IR/CATH LAB   DISCONTD: iohexol (OMNIPAQUE) 350 MG/ML injection   DISCONTD: ticagrelor (BRILINTA) tablet   DISCONTD: lisinopril (ZESTRIL) tablet 5 mg   DISCONTD: amLODipine (NORVASC) tablet 10 mg   venlafaxine XR (EFFEXOR-XR) 24 hr capsule 75  mg   DISCONTD: acetaminophen (TYLENOL) tablet 500 mg   labetalol (NORMODYNE) injection 10 mg   hydrALAZINE (APRESOLINE) injection 10 mg   acetaminophen (TYLENOL) tablet 650 mg   ondansetron (ZOFRAN) injection 4 mg   0.9 %  sodium chloride infusion   sodium chloride flush (NS) 0.9 % injection 3 mL   sodium chloride flush (NS) 0.9 % injection 3 mL   0.9 %  sodium chloride infusion   aspirin chewable tablet 81 mg   ticagrelor (BRILINTA) tablet 90 mg   rosuvastatin (CRESTOR) tablet 40 mg   metoprolol tartrate (LOPRESSOR) tablet 12.5 mg    Unresulted Labs (From admission, onward)     Start     Ordered   07/09/22 0500  Basic metabolic panel  Tomorrow morning,   R        07/09/22 0033   07/09/22 0500  CBC  Tomorrow morning,   R        07/09/22 0033   07/09/22 0500  Lipoprotein A (LPA)  Tomorrow morning,   R        07/09/22 0033   07/09/22 0500  Hemoglobin A1c  Once,   R        07/09/22 0500   07/09/22 0110  T4, free  Add-on,   AD        07/09/22  0109   07/09/22 0110  TSH  Add-on,   AD        07/09/22 0109   07/08/22 2230  Brain natriuretic peptide  Once,   URGENT        07/08/22 2230   07/08/22 2230  Urinalysis, Routine w reflex microscopic  Once,   URGENT        07/08/22 2230             Admission Imaging : CARDIAC CATHETERIZATION  Result Date: 07/09/2022   Dist LAD lesion is 50% stenosed.   Mid LAD lesion is 25% stenosed.   Mid RCA lesion is 100% stenosed.  After aspiration thrombectomy, a drug-eluting stent was successfully placed using a STENT ONYX FRONTIER 3.0X18, postdilated to 3.5 mm.   Post intervention, there is a 0% residual stenosis.   Overall, the left ventricular systolic function is normal. Basal to mid inferior hypokinesis.   LV end diastolic pressure is mildly elevated.   The left ventricular ejection fraction is 55-65% by visual estimate.   There is no aortic valve stenosis. 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  Successful  PCI of the RCA with single stent placement.  The patient tolerated the procedure well.  Results were conveyed to the son. Of note, post procedure, she developed a right forearm hematoma.  Blood pressure cuff was applied in the ICU. She will need dual antiplatelet therapy, beta-blocker, high-dose statin.  She will need to stop smoking.   DG Chest Port 1 View  Result Date: 07/08/2022 CLINICAL DATA:  code stemi EXAM: PORTABLE CHEST 1 VIEW COMPARISON:  None Available. FINDINGS: Cardiac paddles overlie the chest. The heart and mediastinal contours are within normal limits. Interval development of left lung patchy airspace opacities. No pulmonary edema. No definite pleural effusion. No pneumothorax. No acute osseous abnormality. IMPRESSION: Interval development of left lung patchy airspace opacities. Electronically Signed   By: Tish Frederickson M.D.   On: 07/08/2022 22:47      Physical Examination: Vitals:   07/09/22 0025 07/09/22 0030 07/09/22 0035 07/09/22 0040  BP:      Pulse:   66 70  Temp:      Resp: (!) 25 (!) 22 15 (!) 22  Height:      Weight:      SpO2:   93% 99%  TempSrc:      BMI (Calculated):       Physical Exam Vitals and nursing note reviewed.  Constitutional:      General: She is not in acute distress.    Appearance: Normal appearance. She is not ill-appearing, toxic-appearing or diaphoretic.  HENT:     Head: Normocephalic and atraumatic.     Right Ear: Hearing and external ear normal.     Left Ear: Hearing and external ear normal.     Nose: Nose normal. No nasal deformity.     Mouth/Throat:     Lips: Pink.     Mouth: Mucous membranes are moist.     Tongue: No lesions.     Pharynx: Oropharynx is clear.  Eyes:     General: No scleral icterus.    Extraocular Movements: Extraocular movements intact.     Conjunctiva/sclera: Conjunctivae normal.     Pupils: Pupils are equal, round, and reactive to light.  Cardiovascular:     Rate and Rhythm: Normal rate and regular rhythm.      Pulses: Normal pulses.     Heart sounds: Normal heart sounds.  Pulmonary:     Effort: Pulmonary effort is normal.     Breath sounds: Normal breath sounds.  Abdominal:     General: Bowel sounds are normal. There is no distension.     Palpations: Abdomen is soft. There is no mass.     Tenderness: There is no abdominal tenderness. There is no guarding.     Hernia: No hernia is present.  Musculoskeletal:     Right lower leg: No edema.     Left lower leg: No edema.  Skin:    General: Skin is warm.  Neurological:     General: No focal deficit present.     Mental Status: She is alert and oriented to person, place, and time.     Cranial Nerves: Cranial nerves 2-12 are intact.     Motor: Motor function is intact.  Psychiatric:        Attention and Perception: Attention normal.        Mood and Affect: Mood normal.        Speech: Speech normal.        Behavior: Behavior normal. Behavior is cooperative.        Cognition and Memory: Cognition normal.      Assessment and Plan: * Acute inferolateral myocardial infarction (HCC) Pt found to have inferior STEMI.  We will continue with antiplatelet and statin therapy per cards recommendation.  Cardiology managing.  Pt continued on brilinta and aspirin.  Holding amlodipine /lisinopril due to hypotension.  We will monitor closely in ICU.  HTN (hypertension) Regimen with amlodipine, lisinopril,held secondary to low blood pressure. Patient to start beta-blocker therapy per cardiology recommendation when deemed appropriate.   Anemia CBC    Component Value Date/Time   WBC 10.3 07/08/2022 2230   RBC 4.30 07/08/2022 2230   HGB 10.8 (L) 07/08/2022 2230   HGB 13.8 04/19/2012 1106   HCT 34.4 (L) 07/08/2022 2230   HCT 40.6 04/19/2012 1106   PLT 785 (H) 07/08/2022 2230   PLT 208 04/19/2012 1106   MCV 80.0 07/08/2022 2230   MCV 90 04/19/2012 1106   MCH 25.1 (L) 07/08/2022 2230   MCHC 31.4 07/08/2022 2230   RDW 17.4 (H) 07/08/2022  2230   RDW 13.7 04/19/2012 1106   LYMPHSABS 1.4 07/08/2022 2230   MONOABS 0.5 07/08/2022 2230   EOSABS 0.0 07/08/2022 2230   BASOSABS 0.0 07/08/2022 2230   Patient states that she does get periods and they are heavy.  No conjunctival pallor. We will follow.  Hypokalemia We will replace and follow.   Elevated LFTs    Latest Ref Rng & Units 07/08/2022   10:30 PM 01/11/2018   11:25 AM 10/06/2017    8:16 PM  CMP  Glucose 70 - 99 mg/dL 161  096  045   BUN 6 - 20 mg/dL 6  9  12    Creatinine 0.44 - 1.00 mg/dL  4.09  8.11   Sodium 135 - 145 mmol/L 140  139  139   Potassium 3.5 - 5.1 mmol/L 3.3  3.5  3.0   Chloride 98 - 111 mmol/L 100  107  108   CO2 22 - 32 mmol/L 26  26  26    Calcium 8.9 - 10.3 mg/dL 9.7  9.0  8.4   Total Protein 6.5 - 8.1 g/dL 8.6     Total Bilirubin 0.3 - 1.2 mg/dL 0.8     Alkaline Phos 38 - 126 U/L 106     AST 15 - 41  U/L 245     ALT 0 - 44 U/L 58     Suspect secondary to fatty liver. We will obtain acute hepatitis panel. Liver ultrasound if enzymes continue to rise.   Dyslipidemia Started on Crestor by cardiology for patient's STEMI. We will monitor LFTs.   Elevated glucose Suspect patient has underlying diabetes mellitus type 2. We will obtain an A1c and monitor. Heart healthy diet in the meantime.    DVT prophylaxis:  Heparin  Code Status:  Full code  Family Communication:  Nadara Mode (Spouse)  [phone] (Home Phone)   Disposition Plan:  Home  Consults called:  Cardiology: Dr. Eldridge Dace.   Admission status: Inpatient    Unit/ Expected LOS: ICU-18.   Gertha Calkin MD [facility]  6 PM- 2 AM. Please contact me via secure Chat 6 PM-2 AM. [phone] ( Pager ) To contact the [facility] Attending or Consulting provider 7A - 7P or covering provider during after hours 7P -7A, for this patient.   Check the care team in [facility] and look for a) attending/consulting [facility] provider listed and b) the [facility] team listed Log into  [email or web address] and use [place]'s universal password to access. If you do not have the password, please contact the hospital operator. Locate the [facility] provider you are looking for under [facility] and page to a number that you can be directly reached. If you still have difficulty reaching the provider, please page the [facility] (Director on Call) for the Hospitalists listed on amion for assistance. [email or web address] 07/09/2022, 1:20 AM

## 2022-07-09 NOTE — Assessment & Plan Note (Signed)
Regimen with amlodipine, lisinopril,held secondary to low blood pressure. Patient to start beta-blocker therapy per cardiology recommendation when deemed appropriate.

## 2022-07-09 NOTE — Assessment & Plan Note (Signed)
Started on Crestor by cardiology for patient's STEMI. We will monitor LFTs.

## 2022-07-09 NOTE — Progress Notes (Signed)
    Checked on patient in the ICU due to the forearm hematoma.  Blood pressure cuff was then placed on the forearm.  We remove this.  The forearm was much softer than it was prior to blood pressure cuff placement.  2 more rounds of the 15-minute blood pressure cuff inflation were performed.  Blood pressure was initially started over systolic and then ultimately decreased to below systolic to allow for waveform to be noted on plethysmography in the right hand.  Swelling in the forearm continues to decrease.  Instructions given to the patient that if she feels any swelling in her arm, she needs to immediately notify her nurse.  Blood pressure protocol will be repeated.  We went over this with the nurse present as well and she is aware to inflate blood pressure cuff if there are any further signs of bleeding.  I suspect this will get less likely as the blood thinners wear off from the procedure.  Jettie Booze, MD

## 2022-07-09 NOTE — Progress Notes (Signed)
       CROSS COVER NOTE  NAME: Kristin Finley MRN: [id number] DOB : 11-13-1970    Date of Service   07/09/2022   HPI/Events of Note   Medication request received for anxiolytic to aid in sleep.   0400: RN reports patient is able to sleep with Melatonin but intermittently "wakes up panicking"  Interventions   Assessment/Plan: Melatonin Ativan 0.5 mg      This document was prepared using Dragon voice recognition software and may include unintentional dictation errors.  Neomia Glass DNP, MHA, FNP-BC Nurse Practitioner [facility] Pager [phone]

## 2022-07-09 NOTE — Progress Notes (Signed)
TR band off since 0929, no complications, no hematoma present, forearm is slightly swollen, but soft and not painful.

## 2022-07-09 NOTE — Assessment & Plan Note (Signed)
We will replace and follow °

## 2022-07-09 NOTE — Telephone Encounter (Signed)
-----   Message from [place], PA-C sent at 07/09/2022  1:30 PM EDT ----- Regarding: hospital folow-up Pt needs 2 week TOC follow-up with phone call.

## 2022-07-09 NOTE — Progress Notes (Signed)
Rounding Note    Patient Name: Kristin Finley Date of Encounter: 07/09/2022  [place] Cardiologist:New  Subjective   Patient overall doing well post-cath. She denies chest pain and SOB.   Inpatient Medications    Scheduled Meds:  aspirin  81 mg Oral Daily   metoprolol tartrate  12.5 mg Oral BID   potassium chloride  40 mEq Oral TID   rosuvastatin  40 mg Oral Daily   sodium chloride flush  3 mL Intravenous Q12H   ticagrelor  90 mg Oral BID   venlafaxine XR  75 mg Oral Q breakfast   Continuous Infusions:  sodium chloride     PRN Meds: sodium chloride, acetaminophen, ondansetron (ZOFRAN) IV, sodium chloride flush   Vital Signs    Vitals:   07/09/22 0040 07/09/22 0100 07/09/22 0200 07/09/22 0300  BP:  100/84 112/71 99/62  Pulse: 70     Resp: (!) 22 15 (!) 21 (!) 25  Temp:  (!) 97.2 F (36.2 C)    TempSrc:  Oral    SpO2: 99%     Weight:      Height:       No intake or output data in the 24 hours ending 07/09/22 1010    07/08/2022   10:26 PM 01/11/2018   11:23 AM 10/06/2017    8:14 PM  Last 3 Weights  Weight (lbs) 190 lb 190 lb 200 lb  Weight (kg) 86.183 kg 86.183 kg 90.719 kg      Telemetry    NSR HR 70-80s, 3 beats NSVT, PVCs - Personally Reviewed  ECG    pending - Personally Reviewed  Physical Exam   GEN: No acute distress.   Neck: No JVD Cardiac: RRR, no murmurs, rubs, or gallops.  Respiratory: Clear to auscultation bilaterally. GI: Soft, nontender, non-distended  MS: No edema; No deformity. Neuro:  Nonfocal  Psych: Normal affect   Labs    High Sensitivity Troponin:   Recent Labs  Lab 07/08/22 2230  TROPONINIHS >24,000*     Chemistry Recent Labs  Lab 07/08/22 2230 07/09/22 0304  NA 140 140  K 3.3* 3.0*  CL 100 104  CO2 26 26  GLUCOSE 152* 152*  BUN 6 7  CREATININE 0.61 0.56  CALCIUM 9.7 8.4*  MG 1.9  --   PROT 8.6*  --   ALBUMIN 3.5  --   AST 245*  --   ALT 58*  --   ALKPHOS 106  --   BILITOT 0.8  --    GFRNONAA >60 >60  ANIONGAP 14 10    Lipids  Recent Labs  Lab 07/08/22 2230  CHOL 188  TRIG 250*  HDL 39*  LDLCALC 99  CHOLHDL 4.8    Hematology Recent Labs  Lab 07/08/22 2230 07/09/22 0304  WBC 10.3 11.5*  RBC 4.30 3.66*  HGB 10.8* 9.2*  HCT 34.4* 29.5*  MCV 80.0 80.6  MCH 25.1* 25.1*  MCHC 31.4 31.2  RDW 17.4* 17.3*  PLT 785* 693*   Thyroid  Recent Labs  Lab 07/09/22 0304  TSH 3.375  FREET4 0.86    BNP Recent Labs  Lab 07/08/22 2230  BNP 468.0*    DDimer No results for input(s): "DDIMER" in the last 168 hours.   Radiology    US Abdomen Limited RUQ (LIVER/GB)  Result Date: 07/09/2022 CLINICAL DATA:  Abnormal LFTs EXAM: ULTRASOUND ABDOMEN LIMITED RIGHT UPPER QUADRANT COMPARISON:  CT abdomen/pelvis dated 11/21/2008 FINDINGS: Gallbladder: 17 mm gallstone. No gallbladder wall  thickening or pericholecystic fluid. Negative sonographic Murphy's sign. Common bile duct: Diameter: 3 mm Liver: No focal lesion identified. Within normal limits in parenchymal echogenicity. Portal vein is patent on color Doppler imaging with normal direction of blood flow towards the liver. Other: None. IMPRESSION: Cholelithiasis, without associated sonographic findings to suggest acute cholecystitis. Electronically Signed   By: Julian Hy M.D.   On: 07/09/2022 03:43   CARDIAC CATHETERIZATION  Result Date: 07/09/2022   Dist LAD lesion is 50% stenosed.   Mid LAD lesion is 25% stenosed.   Mid RCA lesion is 100% stenosed.  After aspiration thrombectomy, a drug-eluting stent was successfully placed using a STENT ONYX FRONTIER 3.0X18, postdilated to 3.5 mm.   Post intervention, there is a 0% residual stenosis.   Overall, the left ventricular systolic function is normal. Basal to mid inferior hypokinesis.   LV end diastolic pressure is mildly elevated.   The left ventricular ejection fraction is 55-65% by visual estimate.   There is no aortic valve stenosis. Acute inferolateral ST elevation MI  due to occluded right coronary artery.  Successful PCI of the RCA with single stent placement.  The patient tolerated the procedure well.  Results were conveyed to the son. Of note, post procedure, she developed a right forearm hematoma.  Blood pressure cuff was applied in the ICU. She will need dual antiplatelet therapy, beta-blocker, high-dose statin.  She will need to stop smoking.   DG Chest Port 1 View  Result Date: 07/08/2022 CLINICAL DATA:  code stemi EXAM: PORTABLE CHEST 1 VIEW COMPARISON:  None Available. FINDINGS: Cardiac paddles overlie the chest. The heart and mediastinal contours are within normal limits. Interval development of left lung patchy airspace opacities. No pulmonary edema. No definite pleural effusion. No pneumothorax. No acute osseous abnormality. IMPRESSION: Interval development of left lung patchy airspace opacities. Electronically Signed   By: Iven Finn M.D.   On: 07/08/2022 22:47    Cardiac Studies   [facility] 07/08/22   Dist LAD lesion is 50% stenosed.   Mid LAD lesion is 25% stenosed.   Mid RCA lesion is 100% stenosed.  After aspiration thrombectomy, a drug-eluting stent was successfully placed using a STENT ONYX FRONTIER 3.0X18, postdilated to 3.5 mm.   Post intervention, there is a 0% residual stenosis.   Overall, the left ventricular systolic function is normal. Basal to mid inferior hypokinesis.   LV end diastolic pressure is mildly elevated.   The left ventricular ejection fraction is 55-65% by visual estimate.   There is no aortic valve stenosis.   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  Successful PCI of the RCA with single stent placement.  The patient tolerated the procedure well.  Results were conveyed to the son.   Of note, post procedure, she developed a right forearm hematoma.  Blood pressure cuff was applied in the ICU.   She will need dual antiplatelet therapy, beta-blocker, high-dose statin.  She will need to  stop smoking.  Coronary Diagrams  Diagnostic Dominance: Right  Intervention    Patient Profile     51 y.o. female with h/o lupus who is being seen for chest pain.  Assessment & Plan    STEMI CAD s/p DES RCA - presented 10/1 with chest pain and taken for emergent cath - cath showed 100% RCA lesion treated with DES placement, other wise nonobstructive disease, mildly elevated LVEDP, LVEF 55-65%. - post procedure she developed right hematoma and BP cuff was applied>now resolved -  Plan for DAPT with Aspirin and Brilinta 90mg  BID for 12 months. Monitor Hgb - echo ordered - started on Lopressor 12.5mg  BID and Crestor 40mg  daily - no further chest pain reported. Will need SL NTG and cardiac rehab as OP  HTN - Lopressor 12.5mg  BID - BP soft at times  HLD - LDL 99 - Crestor 40mg  daily  For questions or updates, please contact [facility] Please consult [email or web address] for contact info under        Signed, Isiac Breighner Ninfa Meeker, PA-C  07/09/2022, 10:10 AM

## 2022-07-09 NOTE — Progress Notes (Signed)
Pt seen in ICU--resting quietly. Came in yday with ongoing CP and underwent emergent cath for acute inf wall MI with stent in mid RCA No cp or sob Bp soft as expected Cont present meds.  Change to stepdown.

## 2022-07-09 NOTE — Assessment & Plan Note (Signed)
    Latest Ref Rng & Units 07/08/2022   10:30 PM 01/11/2018   11:25 AM 10/06/2017    8:16 PM  CMP  Glucose 70 - 99 mg/dL 152  107  134   BUN 6 - 20 mg/dL 6  9  12    Creatinine 0.44 - 1.00 mg/dL 0.61  0.74  0.70   Sodium 135 - 145 mmol/L 140  139  139   Potassium 3.5 - 5.1 mmol/L 3.3  3.5  3.0   Chloride 98 - 111 mmol/L 100  107  108   CO2 22 - 32 mmol/L 26  26  26    Calcium 8.9 - 10.3 mg/dL 9.7  9.0  8.4   Total Protein 6.5 - 8.1 g/dL 8.6     Total Bilirubin 0.3 - 1.2 mg/dL 0.8     Alkaline Phos 38 - 126 U/L 106     AST 15 - 41 U/L 245     ALT 0 - 44 U/L 58     Suspect secondary to fatty liver. We will obtain acute hepatitis panel. Liver ultrasound if enzymes continue to rise.

## 2022-07-09 NOTE — Progress Notes (Signed)
eLink Physician-Brief Progress Note Patient Name: Kristin Finley DOB: 09-02-1971 MRN: [id number]   Date of Service  07/09/2022  HPI/Events of Note  51/F with history of lupus, presenting with chest pain. EKG consistent with STEMI and pt was brought to the cathlab emergently where she underwent coronary angiography, PCI of RCA performed.   eICU Interventions  - Admit to ICU - Presently chest pain free. Will plan to give NTG if with occurrence of chest pain.  - Serial EKG, trend troponin to peak - Will start on DAPT, beta blocker, statin therapy if without contraindications.  - Plan for echo in [place] 07/09/2022, 12:36 AM

## 2022-07-09 NOTE — Progress Notes (Signed)
Received patient from cath lab, report from [place], upon arrival to unit, cath lab nurses requested a manual blood pressure cuff for forearm hematoma affecting R arm/cathlab site side. Per cardiologist Dr. Irish Lack it is to be inflated to 140-160 mmHg and ensure there is no pleth wave on the SPO2. Even though this is not the correct procedure per clinical key. Dr. Irish Lack acknowledged this, yet still stated to inflate to 140-160 mmHg, until 3rd application of blood pressure cuff, he then stated that I could inflate to 15 mmHg above systolic as per policy.  Will continue to monitor closely and frequently to ensure the best outcome for my patient.

## 2022-07-09 NOTE — Progress Notes (Signed)
[facility] for Electrolyte Monitoring and Replacement   Recent Labs: Potassium (mmol/L)  Date Value  07/09/2022 3.0 (L)  04/19/2012 3.9   Magnesium (mg/dL)  Date Value  07/08/2022 1.9   Calcium (mg/dL)  Date Value  07/09/2022 8.4 (L)   Calcium, Total (mg/dL)  Date Value  04/19/2012 8.7   Albumin (g/dL)  Date Value  07/08/2022 3.5   Sodium (mmol/L)  Date Value  07/09/2022 140  04/19/2012 140     Assessment:  51 y.o. female comes to the hospital in private vehicle with complaint of chest pain and shortness of breath. Pharmacy is asked to follow and replace electrolytes while in CCU  Goal of Therapy:  Electrolytes WNL  Plan:  --40 mEq po KCl x 2 --recheck electrolytes in am  Dallie Piles ,PharmD Clinical Pharmacist 07/09/2022 7:00 AM

## 2022-07-10 ENCOUNTER — Other Ambulatory Visit: Payer: Self-pay

## 2022-07-10 DIAGNOSIS — I10 Essential (primary) hypertension: Secondary | ICD-10-CM

## 2022-07-10 DIAGNOSIS — I2119 ST elevation (STEMI) myocardial infarction involving other coronary artery of inferior wall: Secondary | ICD-10-CM

## 2022-07-10 DIAGNOSIS — E785 Hyperlipidemia, unspecified: Secondary | ICD-10-CM

## 2022-07-10 DIAGNOSIS — I255 Ischemic cardiomyopathy: Secondary | ICD-10-CM

## 2022-07-10 LAB — BASIC METABOLIC PANEL
Anion gap: 5 (ref 5–15)
BUN: 7 mg/dL (ref 6–20)
CO2: 26 mmol/L (ref 22–32)
Calcium: 8.5 mg/dL — ABNORMAL LOW (ref 8.9–10.3)
Chloride: 109 mmol/L (ref 98–111)
Creatinine, Ser: 0.49 mg/dL (ref 0.44–1.00)
GFR, Estimated: >60 mL/min (ref >60)
Glucose, Bld: 115 mg/dL — ABNORMAL HIGH (ref 70–99)
Potassium: 4 mmol/L (ref 3.5–5.1)
Sodium: 140 mmol/L (ref 135–145)

## 2022-07-10 LAB — MAGNESIUM: Magnesium: 1.9 mg/dL (ref 1.7–2.4)

## 2022-07-10 LAB — TYPE AND SCREEN
ABO/RH(D): O POS
Antibody Screen: POSITIVE

## 2022-07-10 LAB — LIPOPROTEIN A (LPA): Lipoprotein (a): 8.5 nmol/L (ref <75.0)

## 2022-07-10 MED ORDER — NITROGLYCERIN 0.4 MG SL SUBL
0.4000 mg | SUBLINGUAL_TABLET | SUBLINGUAL | 3 refills | Status: AC | PRN
  Filled 2022-07-10: qty 25, 8d supply, fill #0

## 2022-07-10 MED ORDER — METOPROLOL SUCCINATE ER 25 MG PO TB24
25.0000 mg | ORAL_TABLET | Freq: Every day | ORAL | 3 refills | Status: DC
  Filled 2022-07-10: qty 30, 30d supply, fill #0

## 2022-07-10 MED ORDER — ROSUVASTATIN CALCIUM 40 MG PO TABS
40.0000 mg | ORAL_TABLET | Freq: Every day | ORAL | 3 refills | Status: DC
  Filled 2022-07-10: qty 30, 30d supply, fill #0

## 2022-07-10 MED ORDER — METOPROLOL SUCCINATE ER 50 MG PO TB24
25.0000 mg | ORAL_TABLET | Freq: Every day | ORAL | Status: DC
  Administered 2022-07-10: 25 mg via ORAL
  Filled 2022-07-10: qty 1

## 2022-07-10 MED ORDER — ASPIRIN 81 MG PO CHEW
81.0000 mg | CHEWABLE_TABLET | Freq: Every day | ORAL | 3 refills | Status: AC
  Filled 2022-07-10: qty 30, 30d supply, fill #0

## 2022-07-10 MED ORDER — TICAGRELOR 90 MG PO TABS
90.0000 mg | ORAL_TABLET | Freq: Two times a day (BID) | ORAL | 3 refills | Status: DC
  Filled 2022-07-10: qty 60, 30d supply, fill #0

## 2022-07-10 NOTE — Progress Notes (Signed)
Rounding Note    Patient Name: Kristin Finley Date of Encounter: 07/10/2022  [facility] Cardiologist: Irving Shows  Subjective   Some transient chest pain noted with deep inspiration.  Improved with acetaminophen overnight.  No shortness of breath or palpitations.  Inpatient Medications    Scheduled Meds:  aspirin  81 mg Oral Daily   Chlorhexidine Gluconate Cloth  6 each Topical Q0600   metoprolol succinate  25 mg Oral Daily   rosuvastatin  40 mg Oral Daily   sodium chloride flush  3 mL Intravenous Q12H   ticagrelor  90 mg Oral BID   venlafaxine XR  75 mg Oral Q breakfast   Continuous Infusions:  sodium chloride     PRN Meds: sodium chloride, acetaminophen, ondansetron (ZOFRAN) IV, sodium chloride flush   Vital Signs    Vitals:   07/10/22 0500 07/10/22 0600 07/10/22 0800 07/10/22 0900  BP: 115/85 122/81 118/74 113/83  Pulse:   67 72  Resp: (!) 25 20 18  (!) 27  Temp:   99 F (37.2 C)   TempSrc:   Oral   SpO2:  96% 99% 97%  Weight:      Height:        Intake/Output Summary (Last 24 hours) at 07/10/2022 0942 Last data filed at 07/10/2022 0800 Gross per 24 hour  Intake 210 ml  Output 700 ml  Net -490 ml      07/08/2022   10:26 PM 01/11/2018   11:23 AM 10/06/2017    8:14 PM  Last 3 Weights  Weight (lbs) 190 lb 190 lb 200 lb  Weight (kg) 86.183 kg 86.183 kg 90.719 kg      Telemetry    Normal sinus rhythm - Personally Reviewed  ECG    No new tracing.  Physical Exam   GEN: No acute distress.   Neck: No JVD Cardiac: RRR, no murmurs, rubs, or gallops.  Right ulnar catheterization site well-healed.  There is some firmness and bruising of the right forearm.  Capillary refill appears normal in the right hand. Respiratory: Clear to auscultation bilaterally. GI: Soft, nontender, non-distended  MS: No edema; No deformity. Neuro:  Nonfocal  Psych: Normal affect   Labs    High Sensitivity Troponin:   Recent Labs  Lab 07/08/22 2230   TROPONINIHS >24,000*     Chemistry Recent Labs  Lab 07/08/22 2230 07/09/22 0304 07/10/22 0426  NA 140 140 140  K 3.3* 3.0* 4.0  CL 100 104 109  CO2 26 26 26   GLUCOSE 152* 152* 115*  BUN 6 7 7   CREATININE 0.61 0.56 0.49  CALCIUM 9.7 8.4* 8.5*  MG 1.9  --  1.9  PROT 8.6*  --   --   ALBUMIN 3.5  --   --   AST 245*  --   --   ALT 58*  --   --   ALKPHOS 106  --   --   BILITOT 0.8  --   --   GFRNONAA >60 >60 >60  ANIONGAP 14 10 5     Lipids  Recent Labs  Lab 07/08/22 2230  CHOL 188  TRIG 250*  HDL 39*  LDLCALC 99  CHOLHDL 4.8    Hematology Recent Labs  Lab 07/08/22 2230 07/09/22 0304  WBC 10.3 11.5*  RBC 4.30 3.66*  HGB 10.8* 9.2*  HCT 34.4* 29.5*  MCV 80.0 80.6  MCH 25.1* 25.1*  MCHC 31.4 31.2  RDW 17.4* 17.3*  PLT 785* 693*  Thyroid  Recent Labs  Lab 07/09/22 0304  TSH 3.375  FREET4 0.86    BNP Recent Labs  Lab 07/08/22 2230  BNP 468.0*    DDimer No results for input(s): "DDIMER" in the last 168 hours.   Radiology    ECHOCARDIOGRAM COMPLETE  Result Date: 07/09/2022    ECHOCARDIOGRAM REPORT   Patient Name:   Kristin Finley Date of Exam: 07/09/2022 Medical Rec #:  [id number]  Height:       67.0 in Accession #:    [id number] Weight:       190.0 lb Date of Birth:  11-10-1970  BSA:          1.979 m Patient Age:    51 years   BP:           96/59 mmHg Patient Gender: F          HR:           79 bpm. Exam Location:  [facility] Procedure: 2D Echo, Cardiac Doppler and Color Doppler Indications:     Acute myocardial Infarction, unspecified I21.9  History:         Patient has no prior history of Echocardiogram examinations.                  Acute MI; Risk Factors:Hypertension.  Sonographer:     Cristela Blue Referring Phys:  [id number] Corky Crafts Diagnosing Phys: Lorine Bears MD IMPRESSIONS  1. Left ventricular ejection fraction, by estimation, is 45 to 50%. The left ventricle has mildly decreased function. The left ventricle demonstrates regional wall motion  abnormalities (see scoring diagram/findings for description). Left ventricular diastolic parameters were normal. There is moderate hypokinesis of the left ventricular, entire inferior wall.  2. Right ventricular systolic function is normal. The right ventricular size is normal. There is normal pulmonary artery systolic pressure.  3. The mitral valve is normal in structure. Mild mitral valve regurgitation. No evidence of mitral stenosis.  4. The aortic valve was not well visualized. Aortic valve regurgitation is mild. No aortic stenosis is present.  5. The inferior vena cava is normal in size with greater than 50% respiratory variability, suggesting right atrial pressure of 3 mmHg. FINDINGS  Left Ventricle: Left ventricular ejection fraction, by estimation, is 45 to 50%. The left ventricle has mildly decreased function. The left ventricle demonstrates regional wall motion abnormalities. Moderate hypokinesis of the left ventricular, entire inferior wall. The left ventricular internal cavity size was normal in size. There is borderline left ventricular hypertrophy. Left ventricular diastolic parameters were normal. Right Ventricle: The right ventricular size is normal. No increase in right ventricular wall thickness. Right ventricular systolic function is normal. There is normal pulmonary artery systolic pressure. The tricuspid regurgitant velocity is 1.86 m/s, and  with an assumed right atrial pressure of 3 mmHg, the estimated right ventricular systolic pressure is 16.8 mmHg. Left Atrium: Left atrial size was normal in size. Right Atrium: Right atrial size was normal in size. Pericardium: There is no evidence of pericardial effusion. Mitral Valve: The mitral valve is normal in structure. Mild mitral valve regurgitation. No evidence of mitral valve stenosis. Tricuspid Valve: The tricuspid valve is normal in structure. Tricuspid valve regurgitation is not demonstrated. No evidence of tricuspid stenosis. Aortic Valve:  The aortic valve was not well visualized. Aortic valve regurgitation is mild. Aortic regurgitation PHT measures 488 msec. No aortic stenosis is present. Aortic valve mean gradient measures 5.0 mmHg. Aortic valve peak gradient measures 9.3 mmHg. Aortic valve  area, by VTI measures 2.70 cm. Pulmonic Valve: The pulmonic valve was normal in structure. Pulmonic valve regurgitation is not visualized. No evidence of pulmonic stenosis. Aorta: The aortic root is normal in size and structure. Venous: The inferior vena cava is normal in size with greater than 50% respiratory variability, suggesting right atrial pressure of 3 mmHg. IAS/Shunts: No atrial level shunt detected by color flow Doppler.  LEFT VENTRICLE PLAX 2D LVIDd:         4.40 cm   Diastology LVIDs:         2.70 cm   LV e' medial:    12.40 cm/s LV PW:         1.20 cm   LV E/e' medial:  7.9 LV IVS:        1.00 cm   LV e' lateral:   11.30 cm/s LVOT diam:     2.10 cm   LV E/e' lateral: 8.7 LV SV:         71 LV SV Index:   36 LVOT Area:     3.46 cm  RIGHT VENTRICLE RV Basal diam:  3.20 cm RV Mid diam:    3.20 cm LEFT ATRIUM             Index        RIGHT ATRIUM           Index LA diam:        4.60 cm 2.32 cm/m   RA Area:     11.90 cm LA Vol (A2C):   58.4 ml 29.52 ml/m  RA Volume:   24.20 ml  12.23 ml/m LA Vol (A4C):   31.4 ml 15.87 ml/m LA Biplane Vol: 44.3 ml 22.39 ml/m  AORTIC VALVE AV Area (Vmax):    2.61 cm AV Area (Vmean):   2.64 cm AV Area (VTI):     2.70 cm AV Vmax:           152.50 cm/s AV Vmean:          101.000 cm/s AV VTI:            0.262 m AV Peak Grad:      9.3 mmHg AV Mean Grad:      5.0 mmHg LVOT Vmax:         115.00 cm/s LVOT Vmean:        77.000 cm/s LVOT VTI:          0.205 m LVOT/AV VTI ratio: 0.78 AI PHT:            488 msec  AORTA Ao Root diam: 2.98 cm MITRAL VALVE               TRICUSPID VALVE MV Area (PHT): 4.49 cm    TR Peak grad:   13.8 mmHg MV Decel Time: 169 msec    TR Vmax:        186.00 cm/s MV E velocity: 98.00 cm/s MV A  velocity: 77.60 cm/s  SHUNTS MV E/A ratio:  1.26        Systemic VTI:  0.20 m                            Systemic Diam: 2.10 cm Kathlyn Sacramento MD Electronically signed by Kathlyn Sacramento MD Signature Date/Time: 07/09/2022/2:53:30 PM    Final    US Abdomen Limited RUQ (LIVER/GB)  Result Date: 07/09/2022 CLINICAL DATA:  Abnormal LFTs EXAM: ULTRASOUND ABDOMEN LIMITED RIGHT UPPER  QUADRANT COMPARISON:  CT abdomen/pelvis dated 11/21/2008 FINDINGS: Gallbladder: 17 mm gallstone. No gallbladder wall thickening or pericholecystic fluid. Negative sonographic Murphy's sign. Common bile duct: Diameter: 3 mm Liver: No focal lesion identified. Within normal limits in parenchymal echogenicity. Portal vein is patent on color Doppler imaging with normal direction of blood flow towards the liver. Other: None. IMPRESSION: Cholelithiasis, without associated sonographic findings to suggest acute cholecystitis. Electronically Signed   By: Charline BillsSriyesh  Krishnan M.D.   On: 07/09/2022 03:43   CARDIAC CATHETERIZATION  Result Date: 07/09/2022   Dist LAD lesion is 50% stenosed.   Mid LAD lesion is 25% stenosed.   Mid RCA lesion is 100% stenosed.  After aspiration thrombectomy, a drug-eluting stent was successfully placed using a STENT ONYX FRONTIER 3.0X18, postdilated to 3.5 mm.   Post intervention, there is a 0% residual stenosis.   Overall, the left ventricular systolic function is normal. Basal to mid inferior hypokinesis.   LV Vaishnavi Dalby diastolic pressure is mildly elevated.   The left ventricular ejection fraction is 55-65% by visual estimate.   There is no aortic valve stenosis. 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  Successful PCI of the RCA with single stent placement.  The patient tolerated the procedure well.  Results were conveyed to the son. Of note, post procedure, she developed a right forearm hematoma.  Blood pressure cuff was applied in the ICU. She will need dual antiplatelet therapy,  beta-blocker, high-dose statin.  She will need to stop smoking.   DG Chest Port 1 View  Result Date: 07/08/2022 CLINICAL DATA:  code stemi EXAM: PORTABLE CHEST 1 VIEW COMPARISON:  None Available. FINDINGS: Cardiac paddles overlie the chest. The heart and mediastinal contours are within normal limits. Interval development of left lung patchy airspace opacities. No pulmonary edema. No definite pleural effusion. No pneumothorax. No acute osseous abnormality. IMPRESSION: Interval development of left lung patchy airspace opacities. Electronically Signed   By: Tish FredericksonMorgane  Naveau M.D.   On: 07/08/2022 22:47    Cardiac Studies   See above.  Patient Profile     51 y.o. female with history of lupus admitted with inferior STEMI status post primary PCI complicated by ischemic cardiomyopathy.  Assessment & Plan    Inferior STEMI: Patient status post primary PCI to the mid RCA with thrombectomy and DES.  She continues to have some transient chest discomfort with deep inspiration that may represent an element of pericarditis.  It has been well controlled with as needed acetaminophen. -Dual antiplatelet therapy with aspirin and ticagrelor for at least 12 months. -Obtain post PCI EKG. -Continue metoprolol succinate 25 mg daily. -Continue rosuvastatin 40 mg daily. -Outpatient cardiac rehab ordered. -Continue as needed acetaminophen for pleuritic chest pain.  If pain becomes more persistent, addition of colchicine could be considered.  Ischemic cardiomyopathy: LVEF mildly reduced with inferior hypokinesis.  Patient appears euvolemic on exam. -Continue metoprolol succinate 25 mg daily. -Defer adding ACE inhibitor/ARB/aldosterone antagonist in the setting of borderline hypotension.  Consider challenging with 1 of these agents at follow-up.  Right forearm hematoma: Patient with post catheterization hematoma following ulnar access.  Right forearm is mildly swollen and tender with normal distal perfusion. -As  needed acetaminophen for pain control. -Patient encouraged to elevate the right arm and apply ice packs as needed.  Hypertension: Blood pressure borderline low. -Continue metoprolol succinate 25 mg daily.  No further agents to be added at this time.  Hyperlipidemia: -Continue rosuvastatin 40 mg daily.  [facility]  will sign off.   Medication Recommendations:  See above. Other recommendations (labs, testing, etc):  None Follow up as an outpatient:  Follow-up with APP in the office in ~2 weeks.  For questions or updates, please contact [facility] Please consult [email or web address] for contact info under [facility].     Signed, Yvonne Kendall, MD  07/10/2022, 9:42 AM

## 2022-07-10 NOTE — Discharge Summary (Signed)
Physician Discharge Summary   Patient: Kristin Finley MRN: [id number] DOB: July 09, 1971  Admit date:     07/08/2022  Discharge date: 07/10/22  Discharge Physician: Enedina Finner   PCP: Armando Gang, FNP   Recommendations at discharge:    F/u Dr Kirke Corin [facility] cardiology in 1 week  Discharge Diagnoses: Principal Problem:   Acute inferolateral myocardial infarction [facility]) Active Problems:   Elevated glucose   Dyslipidemia   Elevated LFTs   Hypokalemia   Anemia   HTN (hypertension)   Hospital Course: Kristin Finley is an 51 y.o. female comes to the hospital in private vehicle with complaint of chest pain and shortness of breath has been going on since 17 September.  STEMI--inferior wall MI, acute CAD s/p DES RCA - presented 10/1 with chest pain and taken for emergent cath. - cath showed 100% RCA lesion treated with DES placement, other wise nonobstructive disease, mildly elevated LVEDP, LVEF 55-65%. - post procedure she developed right hematoma ---improving - Aspirin and Brilinta 90mg  BID for 12 months.  - echo showed EF of 45-50% - change to Toprol XL and cont statins - no further chest pain reported. Will need SL NTG and cardiac rehab as OP   HTN - Toprol XL qd - BP soft--no room to add ARB/ACE--consider as out pt   HLD - LDL 99 - Crestor daily  Smoking cessation advised D/c home today. OK from cardiology standpoint for d/c       Consultants: [facility] cardiology Procedures performed: cath  Disposition: Home Diet recommendation:  Discharge Diet Orders (From admission, onward)     Start     Ordered   07/10/22 0000  Diet - low sodium heart healthy        07/10/22 0816           Cardiac diet DISCHARGE MEDICATION: Allergies as of 07/10/2022       Reactions   Wellbutrin [bupropion] Hives, Itching, Rash   Codeine Hives, Rash        Medication List     STOP taking these medications    amLODipine 10 MG tablet Commonly known as: NORVASC   ibuprofen 200 MG  tablet Commonly known as: ADVIL   lisinopril 5 MG tablet Commonly known as: ZESTRIL   THERAFLU COLD & COUGH PO       TAKE these medications    acetaminophen 500 MG tablet Commonly known as: TYLENOL Take 500 mg by mouth every 6 (six) hours as needed for moderate pain.   aspirin 81 MG chewable tablet Chew 1 tablet (81 mg total) by mouth daily.   cholecalciferol 10 MCG (400 UNIT) Tabs tablet Commonly known as: VITAMIN D3 Take 400 Units by mouth daily.   metoprolol succinate 25 MG 24 hr tablet Commonly known as: TOPROL-XL Take 1 tablet (25 mg total) by mouth daily. Take with or immediately following a meal.   multivitamin with minerals Tabs tablet Take 1 tablet by mouth daily.   nitroGLYCERIN 0.4 MG SL tablet Commonly known as: Nitrostat Place 1 tablet (0.4 mg total) under the tongue every 5 (five) minutes as needed for chest pain.   rosuvastatin 40 MG tablet Commonly known as: CRESTOR Take 1 tablet (40 mg total) by mouth daily.   ticagrelor 90 MG Tabs tablet Commonly known as: BRILINTA Take 1 tablet (90 mg total) by mouth 2 (two) times daily.   venlafaxine XR 75 MG 24 hr capsule Commonly known as: EFFEXOR-XR Take 75 mg by mouth daily with breakfast.   vitamin  B-12 100 MCG tablet Commonly known as: CYANOCOBALAMIN Take 100 mcg by mouth daily.        Follow-up Information     Remi Haggard, FNP. Schedule an appointment as soon as possible for a visit in 1 week(s).   Specialty: Family Medicine Why: STEMI f/u Contact information: [place] [address] [phone]         Wellington Hampshire, MD. Schedule an appointment as soon as possible for a visit in 1 week(s).   Specialty: Cardiology Why: STEMI f/u Contact information: [place] [address] [phone]                Discharge Exam: [name] Weights   07/08/22 2226  Weight: 86.2 kg     Condition at discharge: fair  The results of  significant diagnostics from this hospitalization (including imaging, microbiology, ancillary and laboratory) are listed below for reference.   Imaging Studies: ECHOCARDIOGRAM COMPLETE  Result Date: 07/09/2022    ECHOCARDIOGRAM REPORT   Patient Name:   Kristin Finley Date of Exam: 07/09/2022 Medical Rec #:  [id number]  Height:       67.0 in Accession #:    [id number] Weight:       190.0 lb Date of Birth:  January 27, 1971  BSA:          1.979 m Patient Age:    52 years   BP:           96/59 mmHg Patient Gender: F          HR:           79 bpm. Exam Location:  [facility] Procedure: 2D Echo, Cardiac Doppler and Color Doppler Indications:     Acute myocardial Infarction, unspecified I21.9  History:         Patient has no prior history of Echocardiogram examinations.                  Acute MI; Risk Factors:Hypertension.  Sonographer:     Sherrie Sport Referring Phys:  [place] Diagnosing Phys: Kathlyn Sacramento MD IMPRESSIONS  1. Left ventricular ejection fraction, by estimation, is 45 to 50%. The left ventricle has mildly decreased function. The left ventricle demonstrates regional wall motion abnormalities (see scoring diagram/findings for description). Left ventricular diastolic parameters were normal. There is moderate hypokinesis of the left ventricular, entire inferior wall.  2. Right ventricular systolic function is normal. The right ventricular size is normal. There is normal pulmonary artery systolic pressure.  3. The mitral valve is normal in structure. Mild mitral valve regurgitation. No evidence of mitral stenosis.  4. The aortic valve was not well visualized. Aortic valve regurgitation is mild. No aortic stenosis is present.  5. The inferior vena cava is normal in size with greater than 50% respiratory variability, suggesting right atrial pressure of 3 mmHg. FINDINGS  Left Ventricle: Left ventricular ejection fraction, by estimation, is 45 to 50%. The left ventricle has mildly decreased function. The left  ventricle demonstrates regional wall motion abnormalities. Moderate hypokinesis of the left ventricular, entire inferior wall. The left ventricular internal cavity size was normal in size. There is borderline left ventricular hypertrophy. Left ventricular diastolic parameters were normal. Right Ventricle: The right ventricular size is normal. No increase in right ventricular wall thickness. Right ventricular systolic function is normal. There is normal pulmonary artery systolic pressure. The tricuspid regurgitant velocity is 1.86 m/s, and  with an assumed right atrial pressure of  3 mmHg, the estimated right ventricular systolic pressure is 16.8 mmHg. Left Atrium: Left atrial size was normal in size. Right Atrium: Right atrial size was normal in size. Pericardium: There is no evidence of pericardial effusion. Mitral Valve: The mitral valve is normal in structure. Mild mitral valve regurgitation. No evidence of mitral valve stenosis. Tricuspid Valve: The tricuspid valve is normal in structure. Tricuspid valve regurgitation is not demonstrated. No evidence of tricuspid stenosis. Aortic Valve: The aortic valve was not well visualized. Aortic valve regurgitation is mild. Aortic regurgitation PHT measures 488 msec. No aortic stenosis is present. Aortic valve mean gradient measures 5.0 mmHg. Aortic valve peak gradient measures 9.3 mmHg. Aortic valve area, by VTI measures 2.70 cm. Pulmonic Valve: The pulmonic valve was normal in structure. Pulmonic valve regurgitation is not visualized. No evidence of pulmonic stenosis. Aorta: The aortic root is normal in size and structure. Venous: The inferior vena cava is normal in size with greater than 50% respiratory variability, suggesting right atrial pressure of 3 mmHg. IAS/Shunts: No atrial level shunt detected by color flow Doppler.  LEFT VENTRICLE PLAX 2D LVIDd:         4.40 cm   Diastology LVIDs:         2.70 cm   LV e' medial:    12.40 cm/s LV PW:         1.20 cm   LV E/e'  medial:  7.9 LV IVS:        1.00 cm   LV e' lateral:   11.30 cm/s LVOT diam:     2.10 cm   LV E/e' lateral: 8.7 LV SV:         71 LV SV Index:   36 LVOT Area:     3.46 cm  RIGHT VENTRICLE RV Basal diam:  3.20 cm RV Mid diam:    3.20 cm LEFT ATRIUM             Index        RIGHT ATRIUM           Index LA diam:        4.60 cm 2.32 cm/m   RA Area:     11.90 cm LA Vol (A2C):   58.4 ml 29.52 ml/m  RA Volume:   24.20 ml  12.23 ml/m LA Vol (A4C):   31.4 ml 15.87 ml/m LA Biplane Vol: 44.3 ml 22.39 ml/m  AORTIC VALVE AV Area (Vmax):    2.61 cm AV Area (Vmean):   2.64 cm AV Area (VTI):     2.70 cm AV Vmax:           152.50 cm/s AV Vmean:          101.000 cm/s AV VTI:            0.262 m AV Peak Grad:      9.3 mmHg AV Mean Grad:      5.0 mmHg LVOT Vmax:         115.00 cm/s LVOT Vmean:        77.000 cm/s LVOT VTI:          0.205 m LVOT/AV VTI ratio: 0.78 AI PHT:            488 msec  AORTA Ao Root diam: 2.98 cm MITRAL VALVE               TRICUSPID VALVE MV Area (PHT): 4.49 cm    TR Peak grad:   13.8 mmHg MV Decel  Time: 169 msec    TR Vmax:        186.00 cm/s MV E velocity: 98.00 cm/s MV A velocity: 77.60 cm/s  SHUNTS MV E/A ratio:  1.26        Systemic VTI:  0.20 m                            Systemic Diam: 2.10 cm Lorine BearsMuhammad Arida MD Electronically signed by Lorine BearsMuhammad Arida MD Signature Date/Time: 07/09/2022/2:53:30 PM    Final    US Abdomen Limited RUQ (LIVER/GB)  Result Date: 07/09/2022 CLINICAL DATA:  Abnormal LFTs EXAM: ULTRASOUND ABDOMEN LIMITED RIGHT UPPER QUADRANT COMPARISON:  CT abdomen/pelvis dated 11/21/2008 FINDINGS: Gallbladder: 17 mm gallstone. No gallbladder wall thickening or pericholecystic fluid. Negative sonographic Murphy's sign. Common bile duct: Diameter: 3 mm Liver: No focal lesion identified. Within normal limits in parenchymal echogenicity. Portal vein is patent on color Doppler imaging with normal direction of blood flow towards the liver. Other: None. IMPRESSION: Cholelithiasis, without  associated sonographic findings to suggest acute cholecystitis. Electronically Signed   By: Charline BillsSriyesh  Krishnan M.D.   On: 07/09/2022 03:43   CARDIAC CATHETERIZATION  Result Date: 07/09/2022   Dist LAD lesion is 50% stenosed.   Mid LAD lesion is 25% stenosed.   Mid RCA lesion is 100% stenosed.  After aspiration thrombectomy, a drug-eluting stent was successfully placed using a STENT ONYX FRONTIER [id number], postdilated to 3.5 mm.   Post intervention, there is a 0% residual stenosis.   Overall, the left ventricular systolic function is normal. Basal to mid inferior hypokinesis.   LV end diastolic pressure is mildly elevated.   The left ventricular ejection fraction is 55-65% by visual estimate.   There is no aortic valve stenosis. 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  Successful PCI of the RCA with single stent placement.  The patient tolerated the procedure well.  Results were conveyed to the son. Of note, post procedure, she developed a right forearm hematoma.  Blood pressure cuff was applied in the ICU. She will need dual antiplatelet therapy, beta-blocker, high-dose statin.  She will need to stop smoking.   DG Chest Port 1 View  Result Date: 07/08/2022 CLINICAL DATA:  code stemi EXAM: PORTABLE CHEST 1 VIEW COMPARISON:  None Available. FINDINGS: Cardiac paddles overlie the chest. The heart and mediastinal contours are within normal limits. Interval development of left lung patchy airspace opacities. No pulmonary edema. No definite pleural effusion. No pneumothorax. No acute osseous abnormality. IMPRESSION: Interval development of left lung patchy airspace opacities. Electronically Signed   By: Tish FredericksonMorgane  Naveau M.D.   On: 07/08/2022 22:47    Microbiology: Results for orders placed or performed during the hospital encounter of 07/08/22  Resp Panel by RT-PCR (Flu A&B, Covid) Anterior Nasal Swab     Status: None   Collection Time: 07/08/22 10:30 PM   Specimen: Anterior  Nasal Swab  Result Value Ref Range Status   SARS Coronavirus 2 by RT PCR NEGATIVE NEGATIVE Final    Comment: (NOTE) SARS-CoV-2 target nucleic acids are NOT DETECTED.  The SARS-CoV-2 RNA is generally detectable in upper respiratory specimens during the acute phase of infection. The lowest concentration of SARS-CoV-2 viral copies this assay can detect is 138 copies/mL. A negative result does not preclude SARS-Cov-2 infection and should not be used as the sole basis for treatment or other patient management decisions. A negative result may occur with  improper  specimen collection/handling, submission of specimen other than nasopharyngeal swab, presence of viral mutation(s) within the areas targeted by this assay, and inadequate number of viral copies(<138 copies/mL). A negative result must be combined with clinical observations, patient history, and epidemiological information. The expected result is Negative.  Fact Sheet for Patients:  BloggerCourse.com  Fact Sheet for Healthcare Providers:  SeriousBroker.it  This test is no t yet approved or cleared by the [place] FDA and  has been authorized for detection and/or diagnosis of SARS-CoV-2 by FDA under an Emergency Use Authorization (EUA). This EUA will remain  in effect (meaning this test can be used) for the duration of the COVID-19 declaration under Section 564(b)(1) of the Act, 21 U.S.C.section 360bbb-3(b)(1), unless the authorization is terminated  or revoked sooner.       Influenza A by PCR NEGATIVE NEGATIVE Final   Influenza B by PCR NEGATIVE NEGATIVE Final    Comment: (NOTE) The Xpert Xpress SARS-CoV-2/FLU/RSV plus assay is intended as an aid in the diagnosis of influenza from Nasopharyngeal swab specimens and should not be used as a sole basis for treatment. Nasal washings and aspirates are unacceptable for Xpert Xpress SARS-CoV-2/FLU/RSV testing.  Fact Sheet for  Patients: BloggerCourse.com  Fact Sheet for Healthcare Providers: SeriousBroker.it  This test is not yet approved or cleared by the [place] FDA and has been authorized for detection and/or diagnosis of SARS-CoV-2 by FDA under an Emergency Use Authorization (EUA). This EUA will remain in effect (meaning this test can be used) for the duration of the COVID-19 declaration under Section 564(b)(1) of the Act, 21 U.S.C. section 360bbb-3(b)(1), unless the authorization is terminated or revoked.  Performed at [facility], [address] [place], [place] [address]   MRSA Next Gen by PCR, Nasal     Status: None   Collection Time: 07/09/22  2:02 AM   Specimen: Nasal Mucosa; Nasal Swab  Result Value Ref Range Status   MRSA by PCR Next Gen NOT DETECTED NOT DETECTED Final    Comment: (NOTE) The GeneXpert MRSA Assay (FDA approved for NASAL specimens only), is one component of a comprehensive MRSA colonization surveillance program. It is not intended to diagnose MRSA infection nor to guide or monitor treatment for MRSA infections. Test performance is not FDA approved in patients less than 45 years old. Performed at [facility], [address] [place], [place] [address]     Labs: CBC: Recent Labs  Lab 07/08/22 2230 07/09/22 0304  WBC 10.3 11.5*  NEUTROABS 8.3*  --   HGB 10.8* 9.2*  HCT 34.4* 29.5*  MCV 80.0 80.6  PLT 785* 693*   Basic Metabolic Panel: Recent Labs  Lab 07/08/22 2230 07/09/22 0304 07/10/22 0426  NA 140 140 140  K 3.3* 3.0* 4.0  CL 100 104 109  CO2 26 26 26   GLUCOSE 152* 152* 115*  BUN 6 7 7   CREATININE 0.61 0.56 0.49  CALCIUM 9.7 8.4* 8.5*  MG 1.9  --  1.9   Liver Function Tests: Recent Labs  Lab 07/08/22 2230  AST 245*  ALT 58*  ALKPHOS 106  BILITOT 0.8  PROT 8.6*  ALBUMIN 3.5   CBG: Recent Labs  Lab 07/09/22 0025  GLUCAP 139*    Discharge time spent: greater  than 30 minutes.  Signed: 09/07/22, MD [facility] 07/10/2022

## 2022-07-10 NOTE — Telephone Encounter (Signed)
Pt currently admitted.

## 2022-07-10 NOTE — Telephone Encounter (Signed)
-----   Message from Nelva Bush, MD sent at 07/10/2022  9:58 AM EDT ----- Regarding: Hospital f/u Good morning,  This patient will be discharged later today from [facility].  Could you help arrange for a TCM follow-up visit with any available APP in approximately 2 weeks?  Please let me know if any questions or concerns,.  Thanks.  Chris End

## 2022-07-10 NOTE — TOC Initial Note (Signed)
Transition of Care [facility]) - Initial/Assessment Note    Patient Details  Name: Kristin Finley MRN: [id number] Date of Birth: Dec 20, 1970  Transition of Care [facility]) CM/SW Contact:    Shelbie Hutching, RN Phone Number: 07/10/2022, 8:30 AM  Clinical Narrative:                   Transition of Care (TOC) Screening Note   Patient Details  Name: Kristin Finley Date of Birth: 1971/05/16   Transition of Care [facility]) CM/SW Contact:    Shelbie Hutching, RN Phone Number: 07/10/2022, 8:31 AM    Transition of Care Department [facility]) has reviewed patient and no TOC needs have been identified at this time. We will continue to monitor patient advancement through interdisciplinary progression rounds. If new patient transition needs arise, please place a TOC consult.         Patient Goals and CMS Choice        Expected Discharge Plan and Services           Expected Discharge Date: 07/10/22                                    Prior Living Arrangements/Services                       Activities of Daily Living      Permission Sought/Granted                  Emotional Assessment              Admission diagnosis:  Lupus ([place]) [M32.9] ST elevation myocardial infarction (STEMI), unspecified artery ([place]) [I21.3] Infiltrate of left lung present on chest x-ray [R91.8] Acute inferolateral myocardial infarction [facility]) [I21.19] Patient Active Problem List   Diagnosis Date Noted   Acute inferolateral myocardial infarction ([place]) 07/09/2022   Elevated glucose 07/09/2022   Dyslipidemia 07/09/2022   Elevated LFTs 07/09/2022   Hypokalemia 07/09/2022   Anemia 07/09/2022   HTN (hypertension) 07/09/2022   PCP:  Remi Haggard, FNP Pharmacy:   [facility], [place] [address] [place] [address] Phone: [phone] Fax: [place] [id number] [place], [place] [address] Phone: [phone] Fax: [place] [address] Phone: [phone] Fax: [phone]     Social Determinants of Health (SDOH) Interventions    Readmission Risk Interventions     No data to display

## 2022-07-10 NOTE — Progress Notes (Signed)
Pt given medications by internal pharmacy - all new medications discussed with pharmacist, and all questions answered. Discharge instructions given to pt and discussed with pt. All questions answered and pt verbalizing understanding. Pt to be discharged to home.

## 2022-07-10 NOTE — Discharge Instructions (Signed)
-  Advised smoking cessation

## 2022-07-11 NOTE — Telephone Encounter (Signed)
TOC- 1st attempt. Lmtcb.

## 2022-07-16 ENCOUNTER — Encounter: Payer: Self-pay | Admitting: *Deleted

## 2022-07-16 NOTE — Telephone Encounter (Signed)
Spoke with patient and confirmed upcoming appointment for tomorrow. She confirmed with no further questions at this time.

## 2022-07-17 ENCOUNTER — Encounter: Payer: Self-pay | Admitting: Medical

## 2022-07-17 ENCOUNTER — Ambulatory Visit: Payer: Self-pay | Attending: Medical | Admitting: Medical

## 2022-07-17 VITALS — BP 130/83 | HR 63 | Ht 65.0 in | Wt 192.0 lb

## 2022-07-17 DIAGNOSIS — I5020 Unspecified systolic (congestive) heart failure: Secondary | ICD-10-CM

## 2022-07-17 DIAGNOSIS — I10 Essential (primary) hypertension: Secondary | ICD-10-CM

## 2022-07-17 DIAGNOSIS — I2510 Atherosclerotic heart disease of native coronary artery without angina pectoris: Secondary | ICD-10-CM

## 2022-07-17 DIAGNOSIS — I255 Ischemic cardiomyopathy: Secondary | ICD-10-CM

## 2022-07-17 DIAGNOSIS — E782 Mixed hyperlipidemia: Secondary | ICD-10-CM

## 2022-07-17 MED ORDER — DAPAGLIFLOZIN PROPANEDIOL 10 MG PO TABS: 10.0000 mg | ORAL_TABLET | Freq: Every day | ORAL | 2 refills | Status: DC

## 2022-07-17 NOTE — Progress Notes (Addendum)
Cardiology Office Note:    Date:  07/17/2022   ID:  Kristin Finley, DOB 1971/09/07, MRN [id number]  PCP:  Armando Gang, FNP  [facility] Cardiologist:  None  [facility] Electrophysiologist:  None   Referring MD: Armando Gang, FNP   Chief Complaint: Hospital follow-up  History of Present Illness:    Kristin Finley is a 51 y.o. female with a hx of lupus and recent STEMI who is referred today for hospital follow-up.  Patient was recently admitted with acute STEMI.  She was taken for emergent cath that showed clotted right coronary artery, treated with PCI of RCA with single stent placement.  Heart cath also showed LVEF 55 to 65%, no aortic valve stenosis, otherwise nonobstructive CAD.  Patient tolerated this procedure well.  Of note, postprocedure, she developed a right forearm hematoma.  Patient was started on DAPT with aspirin and Brilinta, plan to continue for 12 months.  Echo showed LVEF 45 to 50%, mild MR. She was started on metoprolol and Crestor.  Today, right arm with some bruising, overall stable. Has occasional chest tightness with a deep breath. She doesn't feel back to normal yet, she still feels very tired and weak. She has occasional SOB. No LLE, orthopnea or pnd. She quit smoking a week ago.   Past Medical History:  Diagnosis Date   History of systemic lupus erythematosus (SLE) (HCC)    Kidney stone    Sjogren's syndrome [facility])     Past Surgical History:  Procedure Laterality Date   BARTHOLIN GLAND CYST EXCISION  10/2016   CORONARY/GRAFT ACUTE MI REVASCULARIZATION N/A 07/08/2022   Procedure: Coronary/Graft Acute MI Revascularization;  Surgeon: Corky Crafts, MD;  Location: [facility] INVASIVE CV LAB;  Service: Cardiovascular;  Laterality: N/A;   LEFT HEART CATH AND CORONARY ANGIOGRAPHY N/A 07/08/2022   Procedure: LEFT HEART CATH AND CORONARY ANGIOGRAPHY;  Surgeon: Corky Crafts, MD;  Location: [facility] INVASIVE CV LAB;  Service: Cardiovascular;  Laterality:  N/A;   lithotripsy Left    TUBAL LIGATION Bilateral     Current Medications: Current Meds  Medication Sig   acetaminophen (TYLENOL) 500 MG tablet Take 500 mg by mouth every 6 (six) hours as needed for moderate pain.   aspirin 81 MG chewable tablet Chew 1 tablet (81 mg total) by mouth daily.   cholecalciferol (VITAMIN D3) 10 MCG (400 UNIT) TABS tablet Take 400 Units by mouth daily.   dapagliflozin propanediol (FARXIGA) 10 MG TABS tablet Take 1 tablet (10 mg total) by mouth daily.   metoprolol succinate (TOPROL-XL) 25 MG 24 hr tablet Take 1 tablet (25 mg total) by mouth daily. Take with or immediately following a meal.   Multiple Vitamin (MULTIVITAMIN WITH MINERALS) TABS tablet Take 1 tablet by mouth daily.   nitroGLYCERIN (NITROSTAT) 0.4 MG SL tablet Place 1 tablet (0.4 mg total) under the tongue every 5 (five) minutes as needed for chest pain.   rosuvastatin (CRESTOR) 40 MG tablet Take 1 tablet (40 mg total) by mouth daily.   ticagrelor (BRILINTA) 90 MG TABS tablet Take 1 tablet (90 mg total) by mouth 2 (two) times daily.   venlafaxine XR (EFFEXOR-XR) 75 MG 24 hr capsule Take 75 mg by mouth daily with breakfast.   vitamin B-12 (CYANOCOBALAMIN) 100 MCG tablet Take 100 mcg by mouth daily.     Allergies:   Wellbutrin [bupropion] and Codeine   Social History   Socioeconomic History   Marital status: Married    Spouse name: Not on file  Number of children: Not on file   Years of education: Not on file   Highest education level: Not on file  Occupational History   Not on file  Tobacco Use   Smoking status: Former    Types: Cigarettes    Quit date: 06/24/2022    Years since quitting: 0.0   Smokeless tobacco: Never  Vaping Use   Vaping Use: Former  Substance and Sexual Activity   Alcohol use: No   Drug use: No   Sexual activity: Yes    Birth control/protection: None, Surgical  Other Topics Concern   Not on file  Social History Narrative   Not on file   Social Determinants  of Health   Financial Resource Strain: Not on file  Food Insecurity: Not on file  Transportation Needs: Not on file  Physical Activity: Not on file  Stress: Not on file  Social Connections: Not on file     Family History: The patient's family history includes Cancer in her father; Diabetes in her sister.  ROS:   Please see the history of present illness.     All other systems reviewed and are negative.  EKGs/Labs/Other Studies Reviewed:    The following studies were reviewed today:  LHC 07/08/22   Dist LAD lesion is 50% stenosed.   Mid LAD lesion is 25% stenosed.   Mid RCA lesion is 100% stenosed.  After aspiration thrombectomy, a drug-eluting stent was successfully placed using a STENT ONYX FRONTIER 3.0X18, postdilated to 3.5 mm.   Post intervention, there is a 0% residual stenosis.   Overall, the left ventricular systolic function is normal. Basal to mid inferior hypokinesis.   LV end diastolic pressure is mildly elevated.   The left ventricular ejection fraction is 55-65% by visual estimate.   There is no aortic valve stenosis.   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  Successful PCI of the RCA with single stent placement.  The patient tolerated the procedure well.  Results were conveyed to the son.   Of note, post procedure, she developed a right forearm hematoma.  Blood pressure cuff was applied in the ICU.   She will need dual antiplatelet therapy, beta-blocker, high-dose statin.  She will need to stop smoking.  Coronary Diagrams   Diagnostic Dominance: Right  Intervention       EKG:  EKG is ordered today.  EKG shows NSR 63bpm, inferior q waves, inferolateral TWI (old)  Recent Labs: 07/08/2022: ALT 58; B Natriuretic Peptide 468.0 07/09/2022: Hemoglobin 9.2; Platelets 693; TSH 3.375 07/10/2022: BUN 7; Creatinine, Ser 0.49; Magnesium 1.9; Potassium 4.0; Sodium 140  Recent Lipid Panel    Component Value Date/Time   CHOL 188  07/08/2022 2230   TRIG 250 (H) 07/08/2022 2230   HDL 39 (L) 07/08/2022 2230   CHOLHDL 4.8 07/08/2022 2230   VLDL 50 (H) 07/08/2022 2230   LDLCALC 99 07/08/2022 2230     Physical Exam:    VS:  BP 130/83 (BP Location: Left Arm, Patient Position: Sitting, Cuff Size: Large)   Pulse 63   Ht 5\' 5"  (1.651 m)   Wt 192 lb (87.1 kg)   SpO2 98%   BMI 31.95 kg/m     Wt Readings from Last 3 Encounters:  07/17/22 192 lb (87.1 kg)  07/08/22 190 lb (86.2 kg)  01/11/18 190 lb (86.2 kg)     GEN:  Well nourished, well developed in no acute distress HEENT: Normal NECK: No JVD; No carotid  bruits LYMPHATICS: No lymphadenopathy CARDIAC: RRR, no murmurs, rubs, gallops RESPIRATORY:  Clear to auscultation without rales, wheezing or rhonchi  ABDOMEN: Soft, non-tender, non-distended MUSCULOSKELETAL:  No edema; No deformity  SKIN: Warm and dry NEUROLOGIC:  Alert and oriented x 3 PSYCHIATRIC:  Normal affect   ASSESSMENT:    1. Coronary artery disease involving native coronary artery of native heart without angina pectoris   2. Systolic heart failure, unspecified HF chronicity ([place])   3. Ischemic cardiomyopathy   4. Essential hypertension   5. Hyperlipidemia, mixed    PLAN:    In order of problems listed above:  STEMI CAD s/p DES RCA 07/08/22 Patient is overall doing well since discharge, she reports fatigue and weakness. Breathing is slowly improving. I will refer to cardiac rehab. Right radial cath site with some bruising up the arm, but overall appears stable. She reports compliance with DAPT, I will check a CBC. Continue Aspirin, Brilinta, SL NTG, Toprol and Crestor.  HFmrEF Echo showed LVEF 45-50%. She is euvolemic on exam. Continue Toprol 25mg  daily. I will add Farxiga 10mg  daily, BMET in 2 weeks. Continue GDMT at follow-up.   HTN BP is good today, add on Farxiga as above. Continue Toprol 25mg  daily.   HLD LDL 99. Continue Crestor 40mg  daily. Can re-check lipid panel/LFTS at  follow-up.   Disposition: Follow up in 3-4 week(s) with MD/APP     Signed, Aengus Sauceda Ninfa Meeker, PA-C  07/17/2022 1:28 PM    [facility]

## 2022-07-17 NOTE — Patient Instructions (Signed)
Medication Instructions:  Your physician has recommended you make the following change in your medication:  START: Farxiga 10mg  daily.  *If you need a refill on your cardiac medications before your next appointment, please call your pharmacy*   Lab Work: Your physician recommends that you have the following lab drawn today: CBC. Also return I'm 2 weeks to have the following lab drawn: BMET  If you have labs (blood work) drawn today and your tests are completely normal, you will receive your results only by: [place] (if you have MyChart) OR A paper copy in the mail If you have any lab test that is abnormal or we need to change your treatment, we will call you to review the results.   Testing/Procedures: NONE ordered at this time of appointment     Follow-Up: At [facility], you and your health needs are our priority.  As part of our continuing mission to provide you with exceptional heart care, we have created designated Provider Care Teams.  These Care Teams include your primary Cardiologist (physician) and Advanced Practice Providers (APPs -  Physician Assistants and Nurse Practitioners) who all work together to provide you with the care you need, when you need it.  We recommend signing up for the patient portal called "MyChart".  Sign up information is provided on this After Visit Summary.  MyChart is used to connect with patients for Virtual Visits (Telemedicine).  Patients are able to view lab/test results, encounter notes, upcoming appointments, etc.  Non-urgent messages can be sent to your provider as well.   To learn more about what you can do with MyChart, go to [email or web address].    Your next appointment:   3-4 week(s)  The format for your next appointment:   In Person  Provider:   You will see one of the following Advanced Practice Providers on your designated Care Team:   Murray Hodgkins, NP Christell Faith, PA-C Cadence Kathlen Mody, PA-C Gerrie Nordmann,  NP  Important Information About Sugar

## 2022-07-18 ENCOUNTER — Other Ambulatory Visit: Payer: Self-pay | Admitting: *Deleted

## 2022-08-07 ENCOUNTER — Emergency Department: Payer: Self-pay

## 2022-08-07 ENCOUNTER — Emergency Department
Admission: EM | Admit: 2022-08-07 | Discharge: 2022-08-07 | Disposition: A | Discharge status: Home / Self Care | Payer: Self-pay | Attending: Emergency Medicine | Admitting: Emergency Medicine

## 2022-08-07 ENCOUNTER — Other Ambulatory Visit: Payer: Self-pay

## 2022-08-07 ENCOUNTER — Encounter: Payer: Self-pay | Admitting: Emergency Medicine

## 2022-08-07 DIAGNOSIS — I10 Essential (primary) hypertension: Secondary | ICD-10-CM | POA: Insufficient documentation

## 2022-08-07 DIAGNOSIS — J810 Acute pulmonary edema: Secondary | ICD-10-CM | POA: Insufficient documentation

## 2022-08-07 DIAGNOSIS — R079 Chest pain, unspecified: Secondary | ICD-10-CM

## 2022-08-07 DIAGNOSIS — R0602 Shortness of breath: Secondary | ICD-10-CM | POA: Insufficient documentation

## 2022-08-07 LAB — CBC
HCT: 32.4 % — ABNORMAL LOW (ref 36.0–46.0)
Hemoglobin: 10 g/dL — ABNORMAL LOW (ref 12.0–15.0)
MCH: 25.6 pg — ABNORMAL LOW (ref 26.0–34.0)
MCHC: 30.9 g/dL (ref 30.0–36.0)
MCV: 83.1 fL (ref 80.0–100.0)
Platelets: 282 10*3/uL (ref 150–400)
RBC: 3.9 MIL/uL (ref 3.87–5.11)
RDW: 17.6 % — ABNORMAL HIGH (ref 11.5–15.5)
WBC: 5.7 10*3/uL (ref 4.0–10.5)
nRBC: 0 % (ref 0.0–0.2)

## 2022-08-07 LAB — BASIC METABOLIC PANEL
Anion gap: 7 (ref 5–15)
BUN: 14 mg/dL (ref 6–20)
CO2: 23 mmol/L (ref 22–32)
Calcium: 8.5 mg/dL — ABNORMAL LOW (ref 8.9–10.3)
Chloride: 107 mmol/L (ref 98–111)
Creatinine, Ser: 0.72 mg/dL (ref 0.44–1.00)
GFR, Estimated: >60 mL/min (ref >60)
Glucose, Bld: 114 mg/dL — ABNORMAL HIGH (ref 70–99)
Potassium: 3.7 mmol/L (ref 3.5–5.1)
Sodium: 137 mmol/L (ref 135–145)

## 2022-08-07 LAB — TROPONIN I (HIGH SENSITIVITY)
Troponin I (High Sensitivity): 14 ng/L (ref <18)
Troponin I (High Sensitivity): 15 ng/L (ref <18)

## 2022-08-07 LAB — BRAIN NATRIURETIC PEPTIDE: B Natriuretic Peptide: 911.1 pg/mL — ABNORMAL HIGH (ref 0.0–100.0)

## 2022-08-07 LAB — POC URINE PREG, ED: Preg Test, Ur: NEGATIVE

## 2022-08-07 MED ORDER — FUROSEMIDE 10 MG/ML IJ SOLN
40.0000 mg | Freq: Once | INTRAMUSCULAR | Status: AC
  Administered 2022-08-07: 40 mg via INTRAVENOUS
  Filled 2022-08-07: qty 4

## 2022-08-07 MED ORDER — FUROSEMIDE 40 MG PO TABS: 40.0000 mg | ORAL_TABLET | Freq: Every day | ORAL | 0 refills | Status: DC

## 2022-08-07 NOTE — ED Provider Triage Note (Signed)
  Emergency Medicine Provider Triage Evaluation Note  Kristin Finley , a 51 y.o.female,  was evaluated in triage.  Pt complains of chest pain.  Patient states she had a stent placed approximately 1 month ago after having an MI.  Since then she has had increased shortness of breath, chest pain, shoulder pain, and swollen lymph nodes.  She states that the symptoms continue to get worse.   Review of Systems  Positive: Chest pain, shortness of breath Negative: Denies fever, abdominal pain, vomiting  Physical Exam   Vitals:   08/07/22 1424  BP: (!) 181/100  Pulse: 79  Resp: 18  Temp: 98.8 F (37.1 C)  SpO2: 99%   Gen:   Awake, no distress   Resp:  Normal effort  MSK:   Moves extremities without difficulty  Other:    Medical Decision Making  Given the patient's initial medical screening exam, the following diagnostic evaluation has been ordered. The patient will be placed in the appropriate treatment space, once one is available, to complete the evaluation and treatment. I have discussed the plan of care with the patient and I have advised the patient that an ED physician or mid-level practitioner will reevaluate their condition after the test results have been received, as the results may give them additional insight into the type of treatment they may need.    Diagnostics: Labs, EKG, CXR  Treatments: none immediately   Teodoro Spray, [place] 08/07/22 1448

## 2022-08-07 NOTE — ED Provider Notes (Signed)
[facility] Provider Note    Event Date/Time   First MD Initiated Contact with Patient 08/07/22 1501     (approximate)   History   Chest Pain   HPI  Kristin Finley is a 51 y.o. female past medical history of inferior STEMI status post PCI to RCA on 10/1, hypertension, hyperlipidemia who presents with chest pain shortness of breath.  Patient was admitted about a month ago for STEMI.  She notes that since she left the hospital she has had worsening dyspnea.  This has been particularly bad over the last week.  Over the last 2 days she has had lower extremity edema.  She is feeling short of breath laying flat which is relatively new as well.  She has had some intermittent chest pressure since leaving the hospital this has been fairly consistent.  She is also having bilateral rib pain that is worse when she takes a deep breath.  She has been compliant with all of her medications.  Seen in follow-up on 10/10.  At that time was reported that she had some occasional chest tightness with deep breath.  Still feeling very tired and weak at that time.    Past Medical History:  Diagnosis Date   History of systemic lupus erythematosus (SLE) (HCC)    Kidney stone    Sjogren's syndrome [facility])     Patient Active Problem List   Diagnosis Date Noted   Ischemic cardiomyopathy    Acute inferolateral myocardial infarction (HCC) 07/09/2022   Elevated glucose 07/09/2022   Dyslipidemia 07/09/2022   Elevated LFTs 07/09/2022   Hypokalemia 07/09/2022   Anemia 07/09/2022   HTN (hypertension) 07/09/2022     Physical Exam  Triage Vital Signs: ED Triage Vitals  Enc Vitals Group     BP 08/07/22 1424 (!) 181/100     Pulse Rate 08/07/22 1424 79     Resp 08/07/22 1424 18     Temp 08/07/22 1424 98.8 F (37.1 C)     Temp Source 08/07/22 1424 Oral     SpO2 08/07/22 1424 99 %     Weight 08/07/22 1424 200 lb (90.7 kg)     Height 08/07/22 1424 5\' 5"  (1.651 m)     Head Circumference --       Peak Flow --      Pain Score 08/07/22 1429 7     Pain Loc --      Pain Edu? --      Excl. in GC? --     Most recent vital signs: Vitals:   08/07/22 1424 08/07/22 1536  BP: (!) 181/100 (!) 178/99  Pulse: 79 80  Resp: 18 18  Temp: 98.8 F (37.1 C)   SpO2: 99% 99%     General: Awake, no distress.  CV:  Good peripheral perfusion.  Pitting edema bilateral lower extremities, symmetric Resp:  Patient is mildly tachypneic with ambulation, lungs sound relatively clear Abd:  No distention.  Neuro:             Awake, Alert, Oriented x 3  Other:     ED Results / Procedures / Treatments  Labs (all labs ordered are listed, but only abnormal results are displayed) Labs Reviewed  BASIC METABOLIC PANEL - Abnormal; Notable for the following components:      Result Value   Glucose, Bld 114 (*)    Calcium 8.5 (*)    All other components within normal limits  CBC - Abnormal; Notable for the  following components:   Hemoglobin 10.0 (*)    HCT 32.4 (*)    MCH 25.6 (*)    RDW 17.6 (*)    All other components within normal limits  BRAIN NATRIURETIC PEPTIDE - Abnormal; Notable for the following components:   B Natriuretic Peptide 911.1 (*)    All other components within normal limits  POC URINE PREG, ED  TROPONIN I (HIGH SENSITIVITY)  TROPONIN I (HIGH SENSITIVITY)     EKG  EKG reviewed interpreted myself shows normal sinus rhythm normal axis inferior T wave inversions   RADIOLOGY Chest x-ray reviewed interpreted myself shows pulmonary edema small pleural effusions   PROCEDURES:  Critical Care performed: No  Procedures   MEDICATIONS ORDERED IN ED: Medications  furosemide (LASIX) injection 40 mg (40 mg Intravenous Given 08/07/22 1558)     IMPRESSION / MDM / ASSESSMENT AND PLAN / ED COURSE  I reviewed the triage vital signs and the nursing notes.                              Patient's presentation is most consistent with acute presentation with potential threat  to life or bodily function.  Differential diagnosis includes, but is not limited to, CHF exacerbation, acute coronary syndrome, pulmonary embolism, post MI pericarditis    The patient is a 51 year old female who is status post STEMI about a month ago presenting with increasing shortness of breath.  Her EF in the hospital was about 45%.  She is not on any diuretic is on metoprolol.  Has had some intermittent chest pain since leaving the hospital which really has not changed as well as bilateral rib pain these are 2 different types of pains.  She has rib pain with inspiration.  Shortness of breath seems to be what is bothering her the most.  She does have new lower extremity edema and orthopnea.  She is hypertensive but is not hypoxic and the rest of her vitals are reassuring.  When I first evaluated her she had walked from the bathroom back to the room and was tachypneic but as she sits down and settles down her work of breathing improves.  Lung sounds clear she does have significant lower extremity edema.  Chest x-ray has some pulmonary edema and pleural effusions.  Troponin is negative EKG showing T wave inversions in the inferior leads which not surprising given her recent inferior STEMI, and looks similar to EKG done on 10/10 during her outpatient visit.  Patient was scheduled to see Dr. Kirke Corin tomorrow in the office.  However she does not have insurance and apparently was going to be started on a very expensive medication so she preferred to come to the ER today first and to reschedule the appointment.  Repeat troponin is negative.  Patient was given dose of IV Lasix in the emergency department her shortness of breath did improve.  She is having no new chest pain.  I reached out to Dr. Okey Dupre who is on-call for [facility] cardiology to discuss the patient given her acute presentation and recent STEMI.  He will reach out to the office in hopes of getting the patient in tomorrow or the next day.  Patient did not  want to stay in the hospital and given her symptoms are improving and that she does not appear to be having any new ischemia today I think that is not unreasonable for her to follow-up as an outpatient.  I  have started her on Lasix just in case she is not able to be seen.  We discussed return to emergency department for worsening symptoms.  FINAL CLINICAL IMPRESSION(S) / ED DIAGNOSES   Final diagnoses:  Acute pulmonary edema (HCC)  Shortness of breath  Chest pain, unspecified type     Rx / DC Orders   ED Discharge Orders          Ordered    furosemide (LASIX) 40 MG tablet  Daily        08/07/22 1753             Note:  This document was prepared using Dragon voice recognition software and may include unintentional dictation errors.   Rada Hay, MD 08/07/22 1755

## 2022-08-07 NOTE — Discharge Instructions (Addendum)
You are having heart failure which is causing some fluid to build up in your lungs and is causing your shortness of breath.  Your heart enzymes were normal and it does not look like you are having a heart attack today.  We have given you a dose of IV Lasix in the emergency department.  Please start taking the Lasix daily.  Dr. Tyrell Antonio office should call you to schedule you for an appointment either tomorrow or Thursday.  If you do not hear from his office please give them a call.  If any of your symptoms are worsening in the meantime please return to the emergency department.

## 2022-08-07 NOTE — ED Triage Notes (Signed)
Pt to ED via POV from home. Pt reports she had a heart stent placed x1 month ago after MI and since have been having increased SOB, CP, shoulder pain and swollen lymph nodes. Pt reports symptoms continue to get worse.

## 2022-08-08 ENCOUNTER — Ambulatory Visit: Payer: Self-pay | Admitting: Medical

## 2022-08-09 ENCOUNTER — Encounter: Payer: Self-pay | Admitting: Cardiovascular Disease

## 2022-08-09 ENCOUNTER — Ambulatory Visit: Payer: Self-pay | Attending: Nurse Practitioner | Admitting: Cardiovascular Disease

## 2022-08-09 VITALS — BP 130/80 | HR 75 | Ht 65.0 in | Wt 199.5 lb

## 2022-08-09 DIAGNOSIS — I2510 Atherosclerotic heart disease of native coronary artery without angina pectoris: Secondary | ICD-10-CM

## 2022-08-09 DIAGNOSIS — Z79899 Other long term (current) drug therapy: Secondary | ICD-10-CM

## 2022-08-09 DIAGNOSIS — I10 Essential (primary) hypertension: Secondary | ICD-10-CM

## 2022-08-09 DIAGNOSIS — E785 Hyperlipidemia, unspecified: Secondary | ICD-10-CM

## 2022-08-09 DIAGNOSIS — I5022 Chronic systolic (congestive) heart failure: Secondary | ICD-10-CM

## 2022-08-09 MED ORDER — CLOPIDOGREL BISULFATE 75 MG PO TABS: 75.0000 mg | ORAL_TABLET | Freq: Every day | ORAL | 3 refills | Status: AC

## 2022-08-09 MED ORDER — METOPROLOL SUCCINATE ER 25 MG PO TB24: 25.0000 mg | ORAL_TABLET | Freq: Every day | ORAL | 3 refills | Status: AC

## 2022-08-09 MED ORDER — ROSUVASTATIN CALCIUM 40 MG PO TABS: 40.0000 mg | ORAL_TABLET | Freq: Every day | ORAL | 3 refills | Status: AC

## 2022-08-09 MED ORDER — LISINOPRIL 5 MG PO TABS: 5.0000 mg | ORAL_TABLET | Freq: Every day | ORAL | 3 refills | Status: AC

## 2022-08-09 MED ORDER — POTASSIUM CHLORIDE ER 10 MEQ PO TBCR: 10.0000 meq | EXTENDED_RELEASE_TABLET | Freq: Every day | ORAL | 3 refills | Status: AC

## 2022-08-09 MED ORDER — FUROSEMIDE 20 MG PO TABS: 20.0000 mg | ORAL_TABLET | Freq: Every day | ORAL | 3 refills | Status: AC

## 2022-08-09 NOTE — Progress Notes (Signed)
Cardiology Office Note   Date:  08/09/2022   ID:  Kristin Finley, DOB Jan 26, 1971, MRN [id number]  PCP:  Armando Gang, FNP  Cardiologist:   Lorine Bears, MD   Chief Complaint  Patient presents with   Other    Post hosp.  C/o abd and chest pain. Meds reviewed verbally with pt.      History of Present Illness: Kristin Finley is a 51 y.o. female who presents for a follow-up visit regarding coronary artery disease and chronic systolic heart failure.  She has known history of lupus and tobacco use.  She presented last month with inferior ST elevation myocardial infarction.  Emergent cardiac catheterization showed an occluded right coronary artery which was treated successfully with PCI and drug-eluting stent placement.  She had right forearm hematoma that was managed conservatively.  Echocardiogram showed an EF of 45 to 50%.  She quit smoking after her myocardial infarction.  She went to the emergency room 2 days ago with pleuritic chest pain and significant worsening of dyspnea.  Her troponin was normal.  BNP was elevated and chest x-ray showed evidence of early pulmonary edema and small bilateral pleural effusions.  She was given 1 dose of IV furosemide 40 mg with significant improvement in symptoms.  She was discharged.  She is feeling significantly better with less shortness of breath.  The pleuritic chest pain also improved.  She continues to have mild bilateral leg edema.  She has been taking her medications regularly but is worried about the cost given that she does not have health insurance.  She cannot afford generic medications.   Past Medical History:  Diagnosis Date   History of systemic lupus erythematosus (SLE) (HCC)    Kidney stone    Sjogren's syndrome [facility])     Past Surgical History:  Procedure Laterality Date   BARTHOLIN GLAND CYST EXCISION  10/2016   CORONARY/GRAFT ACUTE MI REVASCULARIZATION N/A 07/08/2022   Procedure: Coronary/Graft Acute MI Revascularization;   Surgeon: Corky Crafts, MD;  Location: [facility] INVASIVE CV LAB;  Service: Cardiovascular;  Laterality: N/A;   LEFT HEART CATH AND CORONARY ANGIOGRAPHY N/A 07/08/2022   Procedure: LEFT HEART CATH AND CORONARY ANGIOGRAPHY;  Surgeon: Corky Crafts, MD;  Location: [facility] INVASIVE CV LAB;  Service: Cardiovascular;  Laterality: N/A;   lithotripsy Left    TUBAL LIGATION Bilateral      Current Outpatient Medications  Medication Sig Dispense Refill   acetaminophen (TYLENOL) 500 MG tablet Take 500 mg by mouth every 6 (six) hours as needed for moderate pain.     aspirin 81 MG chewable tablet Chew 1 tablet (81 mg total) by mouth daily. 30 tablet 3   cholecalciferol (VITAMIN D3) 10 MCG (400 UNIT) TABS tablet Take 400 Units by mouth daily.     clopidogrel (PLAVIX) 75 MG tablet Take 1 tablet (75 mg total) by mouth daily. 90 tablet 3   furosemide (LASIX) 20 MG tablet Take 1 tablet (20 mg total) by mouth daily. 90 tablet 3   lisinopril (ZESTRIL) 5 MG tablet Take 1 tablet (5 mg total) by mouth daily. 90 tablet 3   Multiple Vitamin (MULTIVITAMIN WITH MINERALS) TABS tablet Take 1 tablet by mouth daily.     nitroGLYCERIN (NITROSTAT) 0.4 MG SL tablet Place 1 tablet (0.4 mg total) under the tongue every 5 (five) minutes as needed for chest pain. 25 tablet 3   potassium chloride (KLOR-CON) 10 MEQ tablet Take 1 tablet (10 mEq total) by mouth daily. 90  tablet 3   venlafaxine XR (EFFEXOR-XR) 75 MG 24 hr capsule Take 75 mg by mouth daily with breakfast.     vitamin B-12 (CYANOCOBALAMIN) 100 MCG tablet Take 100 mcg by mouth daily.     metoprolol succinate (TOPROL-XL) 25 MG 24 hr tablet Take 1 tablet (25 mg total) by mouth daily. Take with or immediately following a meal. 90 tablet 3   rosuvastatin (CRESTOR) 40 MG tablet Take 1 tablet (40 mg total) by mouth daily. 90 tablet 3   No current facility-administered medications for this visit.    Allergies:   Wellbutrin [bupropion] and Codeine    Social History:   The patient  reports that she quit smoking about 6 weeks ago. Her smoking use included cigarettes. She has never used smokeless tobacco. She reports that she does not drink alcohol and does not use drugs.   Family History:  The patient's family history includes Cancer in her father; Diabetes in her sister.    ROS:  Please see the history of present illness.   Otherwise, review of systems are positive for none.   All other systems are reviewed and negative.    PHYSICAL EXAM: VS:  BP 130/80 (BP Location: Left Arm, Patient Position: Sitting, Cuff Size: Normal)   Pulse 75   Ht 5\' 5"  (1.651 m)   Wt 199 lb 8 oz (90.5 kg)   SpO2 95%   BMI 33.20 kg/m  , BMI Body mass index is 33.2 kg/m. GEN: Well nourished, well developed, in no acute distress  HEENT: normal  Neck: no JVD, carotid bruits, or masses Cardiac: RRR; no murmurs, rubs, or gallops, mild bilateral leg edema Respiratory:  clear to auscultation bilaterally, normal work of breathing GI: soft, nontender, nondistended, + BS MS: no deformity or atrophy  Skin: warm and dry, no rash Neuro:  Strength and sensation are intact Psych: euthymic mood, full affect   EKG:  EKG is not ordered today.    Recent Labs: 07/08/2022: ALT 58 07/09/2022: TSH 3.375 07/10/2022: Magnesium 1.9 08/07/2022: B Natriuretic Peptide 911.1; BUN 14; Creatinine, Ser 0.72; Hemoglobin 10.0; Platelets 282; Potassium 3.7; Sodium 137    Lipid Panel    Component Value Date/Time   CHOL 188 07/08/2022 2230   TRIG 250 (H) 07/08/2022 2230   HDL 39 (L) 07/08/2022 2230   CHOLHDL 4.8 07/08/2022 2230   VLDL 50 (H) 07/08/2022 2230   LDLCALC 99 07/08/2022 2230      Wt Readings from Last 3 Encounters:  08/09/22 199 lb 8 oz (90.5 kg)  08/07/22 200 lb (90.7 kg)  07/17/22 192 lb (87.1 kg)          No data to display            ASSESSMENT AND PLAN:  1.  Coronary artery disease involving native coronary arteries: She is doing well overall with no anginal  symptoms.  Recent pleuritic chest pain does not seem to be due to ischemia.  Continue aspirin daily.  She is not able to afford Brilinta.  I elected to switch her to Plavix 75 mg once daily with a 150 mg loading dose on the first day.  2.  Chronic systolic heart failure: Her EF was 45 to 50% she had significant volume overload recently with 8 pounds of weight gain.  She responded very well to IV furosemide 40 mg.  She was discharged home on furosemide 40 mg daily but did not pick up the prescription.  Given quick improvement in symptoms, I  elected to decrease furosemide to 20 mg once daily with potassium chloride 10 mEq once daily.  Continue Toprol.  I added lisinopril 5 mg once daily.  Check basic metabolic profile in 1 week.  3.  Essential hypertension: Blood pressure was elevated recently when she was volume overloaded but subsequently improved.  4.  Hyperlipidemia: I refilled high-dose rosuvastatin.  Recommended target LDL of less than 70.    Disposition: Follow-up in 1 month.  Signed,  Lorine Bears, MD  08/09/2022 5:08 PM    [facility]

## 2022-08-09 NOTE — Patient Instructions (Signed)
Medication Instructions:  1.Stop brilinta 2.Change lasix to 20 mg daily 3.Start potassium chloride 10 meq daily 4.Start plavix 75 mg, take 2 tablets on the first day then take one tablet daily thereafter 5.Start lisinopril 5 mg daily *If you need a refill on your cardiac medications before your next appointment, please call your pharmacy*   Lab Work: BMET in one week - Please go to the [facility]. You will check in at the front desk to the right as you walk into the atrium. Valet Parking is offered if needed. - No appointment needed. You may go any day between 7 am and 6 pm.   If you have labs (blood work) drawn today and your tests are completely normal, you will receive your results only by: [place] (if you have MyChart) OR A paper copy in the mail If you have any lab test that is abnormal or we need to change your treatment, we will call you to review the results.   Follow-Up: At [facility], you and your health needs are our priority.  As part of our continuing mission to provide you with exceptional heart care, we have created designated Provider Care Teams.  These Care Teams include your primary Cardiologist (physician) and Advanced Practice Providers (APPs -  Physician Assistants and Nurse Practitioners) who all work together to provide you with the care you need, when you need it.  We recommend signing up for the patient portal called "MyChart".  Sign up information is provided on this After Visit Summary.  MyChart is used to connect with patients for Virtual Visits (Telemedicine).  Patients are able to view lab/test results, encounter notes, upcoming appointments, etc.  Non-urgent messages can be sent to your provider as well.   To learn more about what you can do with MyChart, go to [email or web address].    Your next appointment:   1 month(s)  The format for your next appointment:   In Person  Provider:   Kathlyn Sacramento, MD   Important  Information About Sugar

## 2022-08-14 ENCOUNTER — Ambulatory Visit: Payer: Self-pay | Admitting: Nurse Practitioner

## 2022-09-10 ENCOUNTER — Ambulatory Visit: Payer: Self-pay | Attending: Medical | Admitting: Medical

## 2022-09-10 NOTE — Progress Notes (Deleted)
Cardiology Office Note:    Date:  09/10/2022   ID:  Kristin Finley, DOB [birth year], MRN [id number]  PCP:  Armando Gang, FNP  [facility] Cardiologist:  None  [facility] Electrophysiologist:  None   Referring MD: Armando Gang, FNP   Chief Complaint: 1 month follow-up  History of Present Illness:    Kristin Finley is a 51 y.o. female with a hx of lupus, CAD s/p PCI/DES RCA, HFmrEF LVEF 45-50% who presents for follow-up.   Patient was admitted in October 2023 with acute STEMI.  She was taken for emergent cath that showed clotted right coronary artery, treated with PCI of RCA with single stent placement.  Heart cath also showed LVEF 55 to 65%, no aortic valve stenosis, otherwise nonobstructive CAD.  Patient tolerated this procedure well.  Of note, postprocedure, she developed a right forearm hematoma.  Patient was started on DAPT with aspirin and Brilinta, plan to continue for 12 months.  Echo showed LVEF 45 to 50%, mild MR. She was started on metoprolol and Crestor.   She went to the ER 08/07/22 for pleuritic chest pain. BP was 180/100. Troponin was negative. EKG showed residual changes from STEMI. CXR showed pulmonary edema and pleural effusions and she was given lasix.   She was seen in the office 08/09/22 and was doing well. She could not afford Brilinta, so she was switched to Plavix. Lasix was decreased from 40 to 20mg  daily. Lisinopril was added.   Today,   BMET HTN GDMT for CHF  Past Medical History:  Diagnosis Date   History of systemic lupus erythematosus (SLE) (HCC)    Kidney stone    Sjogren's syndrome [facility])     Past Surgical History:  Procedure Laterality Date   BARTHOLIN GLAND CYST EXCISION  10/2016   CORONARY/GRAFT ACUTE MI REVASCULARIZATION N/A 07/08/2022   Procedure: Coronary/Graft Acute MI Revascularization;  Surgeon: 09/07/2022, MD;  Location: [facility] INVASIVE CV LAB;  Service: Cardiovascular;  Laterality: N/A;   LEFT HEART CATH AND CORONARY  ANGIOGRAPHY N/A 07/08/2022   Procedure: LEFT HEART CATH AND CORONARY ANGIOGRAPHY;  Surgeon: 09/07/2022, MD;  Location: [facility] INVASIVE CV LAB;  Service: Cardiovascular;  Laterality: N/A;   lithotripsy Left    TUBAL LIGATION Bilateral     Current Medications: No outpatient medications have been marked as taking for the 09/10/22 encounter (Appointment) with 14/4/23, Jeovanni Heuring H, PA-C.     Allergies:   Wellbutrin [bupropion] and Codeine   Social History   Socioeconomic History   Marital status: Married    Spouse name: Not on file   Number of children: Not on file   Years of education: Not on file   Highest education level: Not on file  Occupational History   Not on file  Tobacco Use   Smoking status: Former    Types: Cigarettes    Quit date: 06/24/2022    Years since quitting: 0.2   Smokeless tobacco: Never  Vaping Use   Vaping Use: Former  Substance and Sexual Activity   Alcohol use: No   Drug use: No   Sexual activity: Yes    Birth control/protection: None, Surgical  Other Topics Concern   Not on file  Social History Narrative   Not on file   Social Determinants of Health   Financial Resource Strain: Not on file  Food Insecurity: Not on file  Transportation Needs: Not on file  Physical Activity: Not on file  Stress: Not on file  Social  Connections: Not on file     Family History: The patient's family history includes Cancer in her father; Diabetes in her sister.  ROS:   Please see the history of present illness.     All other systems reviewed and are negative.  EKGs/Labs/Other Studies Reviewed:    The following studies were reviewed today:  Echo 07/2022  1. Left ventricular ejection fraction, by estimation, is 45 to 50%. The  left ventricle has mildly decreased function. The left ventricle  demonstrates regional wall motion abnormalities (see scoring  diagram/findings for description). Left ventricular  diastolic parameters were normal. There is  moderate hypokinesis of the  left ventricular, entire inferior wall.   2. Right ventricular systolic function is normal. The right ventricular  size is normal. There is normal pulmonary artery systolic pressure.   3. The mitral valve is normal in structure. Mild mitral valve  regurgitation. No evidence of mitral stenosis.   4. The aortic valve was not well visualized. Aortic valve regurgitation  is mild. No aortic stenosis is present.   5. The inferior vena cava is normal in size with greater than 50%  respiratory variability, suggesting right atrial pressure of 3 mmHg.   Cardiac cath 07/2022     Dist LAD lesion is 50% stenosed.   Mid LAD lesion is 25% stenosed.   Mid RCA lesion is 100% stenosed.  After aspiration thrombectomy, a drug-eluting stent was successfully placed using a STENT ONYX FRONTIER 3.0X18, postdilated to 3.5 mm.   Post intervention, there is a 0% residual stenosis.   Overall, the left ventricular systolic function is normal. Basal to mid inferior hypokinesis.   LV end diastolic pressure is mildly elevated.   The left ventricular ejection fraction is 55-65% by visual estimate.   There is no aortic valve stenosis.   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  Successful PCI of the RCA with single stent placement.  The patient tolerated the procedure well.  Results were conveyed to the son.   Of note, post procedure, she developed a right forearm hematoma.  Blood pressure cuff was applied in the ICU.   She will need dual antiplatelet therapy, beta-blocker, high-dose statin.  She will need to stop smoking.  Coronary Diagrams  Diagnostic Dominance: Right  Intervention      EKG:  EKG is *** ordered today.  The ekg ordered today demonstrates ***  Recent Labs: 07/08/2022: ALT 58 07/09/2022: TSH 3.375 07/10/2022: Magnesium 1.9 08/07/2022: B Natriuretic Peptide 911.1; BUN 14; Creatinine, Ser 0.72; Hemoglobin 10.0; Platelets 282;  Potassium 3.7; Sodium 137  Recent Lipid Panel    Component Value Date/Time   CHOL 188 07/08/2022 2230   TRIG 250 (H) 07/08/2022 2230   HDL 39 (L) 07/08/2022 2230   CHOLHDL 4.8 07/08/2022 2230   VLDL 50 (H) 07/08/2022 2230   LDLCALC 99 07/08/2022 2230     Risk Assessment/Calculations:   {Does this patient have ATRIAL FIBRILLATION?:[id number]}   Physical Exam:    VS:  There were no vitals taken for this visit.    Wt Readings from Last 3 Encounters:  08/09/22 199 lb 8 oz (90.5 kg)  08/07/22 200 lb (90.7 kg)  07/17/22 192 lb (87.1 kg)     GEN: *** Well nourished, well developed in no acute distress HEENT: Normal NECK: No JVD; No carotid bruits LYMPHATICS: No lymphadenopathy CARDIAC: ***RRR, no murmurs, rubs, gallops RESPIRATORY:  Clear to auscultation without rales, wheezing or rhonchi  ABDOMEN: Soft, non-tender, non-distended  MUSCULOSKELETAL:  No edema; No deformity  SKIN: Warm and dry NEUROLOGIC:  Alert and oriented x 3 PSYCHIATRIC:  Normal affect   ASSESSMENT:    No diagnosis found. PLAN:    In order of problems listed above:  CAD  Chronic systolic heart failure ICM  HTN  HLD  Disposition: Follow up {follow up:15908} with ***   Shared Decision Making/Informed Consent   {Are you ordering a CV Procedure (e.g. stress test, cath, DCCV, TEE, etc)?   Press F2        :[id number]}    Signed, Darriona Dehaas David Stall, PA-C  09/10/2022 8:50 AM    [facility]

## 2022-09-11 ENCOUNTER — Encounter: Payer: Self-pay | Admitting: Medical

## 2023-08-23 ENCOUNTER — Emergency Department
Admission: EM | Admit: 2023-08-23 | Discharge: 2023-08-23 | Disposition: A | Discharge status: Home / Self Care | Payer: Self-pay | Attending: Emergency Medicine | Admitting: Emergency Medicine

## 2023-08-23 ENCOUNTER — Emergency Department: Payer: Self-pay

## 2023-08-23 ENCOUNTER — Other Ambulatory Visit: Payer: Self-pay

## 2023-08-23 DIAGNOSIS — I10 Essential (primary) hypertension: Secondary | ICD-10-CM | POA: Insufficient documentation

## 2023-08-23 DIAGNOSIS — B974 Respiratory syncytial virus as the cause of diseases classified elsewhere: Secondary | ICD-10-CM | POA: Insufficient documentation

## 2023-08-23 DIAGNOSIS — B338 Other specified viral diseases: Secondary | ICD-10-CM

## 2023-08-23 DIAGNOSIS — I2510 Atherosclerotic heart disease of native coronary artery without angina pectoris: Secondary | ICD-10-CM | POA: Insufficient documentation

## 2023-08-23 DIAGNOSIS — Z20822 Contact with and (suspected) exposure to covid-19: Secondary | ICD-10-CM | POA: Insufficient documentation

## 2023-08-23 DIAGNOSIS — R0602 Shortness of breath: Secondary | ICD-10-CM | POA: Insufficient documentation

## 2023-08-23 HISTORY — DX: Essential (primary) hypertension: I10

## 2023-08-23 LAB — BASIC METABOLIC PANEL
Anion gap: 11 (ref 5–15)
BUN: 9 mg/dL (ref 6–20)
CO2: 24 mmol/L (ref 22–32)
Calcium: 8.8 mg/dL — ABNORMAL LOW (ref 8.9–10.3)
Chloride: 104 mmol/L (ref 98–111)
Creatinine, Ser: 0.76 mg/dL (ref 0.44–1.00)
GFR, Estimated: >60 mL/min (ref >60)
Glucose, Bld: 110 mg/dL — ABNORMAL HIGH (ref 70–99)
Potassium: 3.6 mmol/L (ref 3.5–5.1)
Sodium: 139 mmol/L (ref 135–145)

## 2023-08-23 LAB — CBC
HCT: 45.9 % (ref 36.0–46.0)
Hemoglobin: 14.8 g/dL (ref 12.0–15.0)
MCH: 27.3 pg (ref 26.0–34.0)
MCHC: 32.2 g/dL (ref 30.0–36.0)
MCV: 84.7 fL (ref 80.0–100.0)
Platelets: 315 10*3/uL (ref 150–400)
RBC: 5.42 MIL/uL — ABNORMAL HIGH (ref 3.87–5.11)
RDW: 15.2 % (ref 11.5–15.5)
WBC: 8.6 10*3/uL (ref 4.0–10.5)
nRBC: 0 % (ref 0.0–0.2)

## 2023-08-23 LAB — RESP PANEL BY RT-PCR (RSV, FLU A&B, COVID)  RVPGX2
Influenza A by PCR: NEGATIVE
Influenza B by PCR: NEGATIVE
Resp Syncytial Virus by PCR: POSITIVE — AB
SARS Coronavirus 2 by RT PCR: NEGATIVE

## 2023-08-23 LAB — BRAIN NATRIURETIC PEPTIDE: B Natriuretic Peptide: 508.9 pg/mL — ABNORMAL HIGH (ref 0.0–100.0)

## 2023-08-23 LAB — TROPONIN I (HIGH SENSITIVITY): Troponin I (High Sensitivity): 16 ng/L (ref <18)

## 2023-08-23 LAB — POC URINE PREG, ED: Preg Test, Ur: NEGATIVE

## 2023-08-23 MED ORDER — PREDNISONE 50 MG PO TABS: 50.0000 mg | ORAL_TABLET | Freq: Every day | ORAL | 0 refills | Status: AC

## 2023-08-23 MED ORDER — METHYLPREDNISOLONE SODIUM SUCC 125 MG IJ SOLR
125.0000 mg | Freq: Once | INTRAMUSCULAR | Status: AC
  Administered 2023-08-23: 125 mg via INTRAVENOUS
  Filled 2023-08-23: qty 2

## 2023-08-23 MED ORDER — IPRATROPIUM-ALBUTEROL 0.5-2.5 (3) MG/3ML IN SOLN
3.0000 mL | Freq: Once | RESPIRATORY_TRACT | Status: AC
  Administered 2023-08-23: 3 mL via RESPIRATORY_TRACT
  Filled 2023-08-23: qty 3

## 2023-08-23 MED ORDER — ALBUTEROL SULFATE HFA 108 (90 BASE) MCG/ACT IN AERS: 2.0000 | INHALATION_SPRAY | Freq: Four times a day (QID) | RESPIRATORY_TRACT | 2 refills | Status: AC | PRN

## 2023-08-23 NOTE — ED Triage Notes (Signed)
Pt to ED for left shoulder pain started yesterday, report worsening with movement. Also reports has been coughing a lot and makes her nauseous. Reports feels similar to when she had a MI

## 2023-08-23 NOTE — ED Notes (Addendum)
Pt diaphoretic, continuously coughing in triage

## 2023-08-23 NOTE — ED Provider Notes (Signed)
[facility] Provider Note    Event Date/Time   First MD Initiated Contact with Patient 08/23/23 [phone]     (approximate)   History   Shoulder Pain   HPI  Kristin Finley is a 52 y.o. female with a history of CAD, lupus, hypertension who presents with complaints of cough, shortness of breath which started yesterday.  She reports that she developed runny nose and shortly thereafter frequent cough significantly worsens with lying down and she feels short of breath.  No chest pain at this time.  No fevers reported.  She does smoke     Physical Exam   Triage Vital Signs: ED Triage Vitals  Encounter Vitals Group     BP 08/23/23 0734 (!) 205/107     Systolic BP Percentile --      Diastolic BP Percentile --      Pulse Rate 08/23/23 0734 93     Resp 08/23/23 0734 18     Temp 08/23/23 0733 97.8 F (36.6 C)     Temp src --      SpO2 08/23/23 0734 99 %     Weight 08/23/23 0734 86.2 kg (190 lb)     Height 08/23/23 0734 1.651 m (5\' 5" )     Head Circumference --      Peak Flow --      Pain Score 08/23/23 0734 4     Pain Loc --      Pain Education --      Exclude from Growth Chart --     Most recent vital signs: Vitals:   08/23/23 0900 08/23/23 0915  BP: (!) 179/92   Pulse: 78 74  Resp: (!) 21 (!) 21  Temp:    SpO2: 98% 97%     General: Awake, no distress.  CV:  Good peripheral perfusion.  Resp:  Normal effort.  No significant wheezing, bibasilar Rales Abd:  No distention.  Other:  No lower extremity edema   ED Results / Procedures / Treatments   Labs (all labs ordered are listed, but only abnormal results are displayed) Labs Reviewed  RESP PANEL BY RT-PCR (RSV, FLU A&B, COVID)  RVPGX2 - Abnormal; Notable for the following components:      Result Value   Resp Syncytial Virus by PCR POSITIVE (*)    All other components within normal limits  BASIC METABOLIC PANEL - Abnormal; Notable for the following components:   Glucose, Bld 110 (*)     Calcium 8.8 (*)    All other components within normal limits  CBC - Abnormal; Notable for the following components:   RBC 5.42 (*)    All other components within normal limits  BRAIN NATRIURETIC PEPTIDE - Abnormal; Notable for the following components:   B Natriuretic Peptide 508.9 (*)    All other components within normal limits  POC URINE PREG, ED  TROPONIN I (HIGH SENSITIVITY)  TROPONIN I (HIGH SENSITIVITY)     EKG  ED ECG REPORT I, Jene Every, the attending physician, personally viewed and interpreted this ECG.  Date: 08/23/2023  Rhythm: normal sinus rhythm QRS Axis: normal Intervals: Abnormal ST/T Wave abnormalities: Nonspecific changes Narrative Interpretation: no evidence of acute ischemia    RADIOLOGY Chest x-ray viewed interpret by me, vascular congestion, no clear pneumonia    PROCEDURES:  Critical Care performed:   Procedures   MEDICATIONS ORDERED IN ED: Medications  ipratropium-albuterol (DUONEB) 0.5-2.5 (3) MG/3ML nebulizer solution 3 mL (3 mLs Nebulization Given 08/23/23 0829)  ipratropium-albuterol (DUONEB) 0.5-2.5 (3) MG/3ML nebulizer solution 3 mL (3 mLs Nebulization Given 08/23/23 1020)  methylPREDNISolone sodium succinate (SOLU-MEDROL) 125 mg/2 mL injection 125 mg (125 mg Intravenous Given 08/23/23 1018)     IMPRESSION / MDM / ASSESSMENT AND PLAN / ED COURSE  I reviewed the triage vital signs and the nursing notes. Patient's presentation is most consistent with acute presentation with potential threat to life or bodily function.  Patient with history of CAD presents with cough, shortness of breath with lying down.  She is hypertensive here.  Review of records demonstrates she was admitted to the hospital for acute pulmonary edema on August 07, 2022  Differential includes viral upper respiratory infection, pneumonia, pulmonary edema, bronchospasm  Chest x-ray demonstrates bilateral pleural effusions and vascular congestion, suspicious for  edema  Will treat with DuoNeb given her smoking history to see if this helps with her cough, pending labs, BNP  Lab work notable for positive RSV, this fits with her presentation of upper respiratory infection with cough, otherwise lab work is generally unchanged from prior, no evidence of pneumonia on chest x-ray.  She is feeling much better after DuoNebs, will start steroids for a brief course, follow-up with PCP, return precautions discussed, she agrees to this plan.      FINAL CLINICAL IMPRESSION(S) / ED DIAGNOSES   Final diagnoses:  RSV (respiratory syncytial virus infection)     Rx / DC Orders   ED Discharge Orders          Ordered    predniSONE (DELTASONE) 50 MG tablet  Daily with breakfast        08/23/23 0941    albuterol (VENTOLIN HFA) 108 (90 Base) MCG/ACT inhaler  Every 6 hours PRN        08/23/23 0941             Note:  This document was prepared using Dragon voice recognition software and may include unintentional dictation errors.   Jene Every, MD 08/23/23 1058

## 2023-08-23 NOTE — ED Notes (Signed)
Rad at bedside.
